# Patient Record
Sex: Male | Born: 1955 | Race: White | Hispanic: No | Marital: Married | Smoking: Current every day smoker
Health system: Southern US, Community
[De-identification: ages and names within clinical notes are randomized; demographics above are authoritative.]

## PROBLEM LIST (undated history)

## (undated) DIAGNOSIS — I509 Heart failure, unspecified: Secondary | ICD-10-CM

## (undated) DIAGNOSIS — N200 Calculus of kidney: Secondary | ICD-10-CM

## (undated) DIAGNOSIS — F17200 Nicotine dependence, unspecified, uncomplicated: Secondary | ICD-10-CM

## (undated) DIAGNOSIS — E7800 Pure hypercholesterolemia, unspecified: Secondary | ICD-10-CM

## (undated) DIAGNOSIS — I10 Essential (primary) hypertension: Secondary | ICD-10-CM

## (undated) DIAGNOSIS — J449 Chronic obstructive pulmonary disease, unspecified: Secondary | ICD-10-CM

---

## 1998-12-21 ENCOUNTER — Observation Stay (HOSPITAL_COMMUNITY): Admission: EM | Admit: 1998-12-21 | Discharge: 1998-12-21 | Payer: Self-pay | Admitting: Cardiology

## 2000-08-28 ENCOUNTER — Emergency Department (HOSPITAL_COMMUNITY): Admission: EM | Admit: 2000-08-28 | Discharge: 2000-08-28 | Payer: Self-pay | Admitting: Internal Medicine

## 2000-08-28 ENCOUNTER — Encounter: Payer: Self-pay | Admitting: Internal Medicine

## 2001-02-26 ENCOUNTER — Emergency Department (HOSPITAL_COMMUNITY): Admission: EM | Admit: 2001-02-26 | Discharge: 2001-02-26 | Payer: Self-pay | Admitting: Emergency Medicine

## 2001-09-16 ENCOUNTER — Ambulatory Visit (HOSPITAL_COMMUNITY): Admission: RE | Admit: 2001-09-16 | Discharge: 2001-09-16 | Payer: Self-pay | Admitting: Urology

## 2001-09-16 ENCOUNTER — Encounter: Payer: Self-pay | Admitting: Urology

## 2001-09-17 ENCOUNTER — Emergency Department (HOSPITAL_COMMUNITY): Admission: EM | Admit: 2001-09-17 | Discharge: 2001-09-17 | Payer: Self-pay | Admitting: Emergency Medicine

## 2001-10-08 ENCOUNTER — Emergency Department (HOSPITAL_COMMUNITY): Admission: EM | Admit: 2001-10-08 | Discharge: 2001-10-08 | Payer: Self-pay | Admitting: Emergency Medicine

## 2001-11-02 ENCOUNTER — Emergency Department (HOSPITAL_COMMUNITY): Admission: EM | Admit: 2001-11-02 | Discharge: 2001-11-02 | Payer: Self-pay | Admitting: Emergency Medicine

## 2001-11-13 ENCOUNTER — Emergency Department (HOSPITAL_COMMUNITY): Admission: EM | Admit: 2001-11-13 | Discharge: 2001-11-13 | Payer: Self-pay | Admitting: Emergency Medicine

## 2001-11-13 ENCOUNTER — Encounter: Payer: Self-pay | Admitting: Urology

## 2001-11-13 ENCOUNTER — Ambulatory Visit (HOSPITAL_COMMUNITY): Admission: RE | Admit: 2001-11-13 | Discharge: 2001-11-13 | Payer: Self-pay | Admitting: Urology

## 2002-01-01 ENCOUNTER — Emergency Department (HOSPITAL_COMMUNITY): Admission: EM | Admit: 2002-01-01 | Discharge: 2002-01-01 | Payer: Self-pay | Admitting: *Deleted

## 2002-03-18 ENCOUNTER — Emergency Department (HOSPITAL_COMMUNITY): Admission: EM | Admit: 2002-03-18 | Discharge: 2002-03-18 | Payer: Self-pay | Admitting: Emergency Medicine

## 2002-03-25 ENCOUNTER — Emergency Department (HOSPITAL_COMMUNITY): Admission: EM | Admit: 2002-03-25 | Discharge: 2002-03-25 | Payer: Self-pay | Admitting: Emergency Medicine

## 2002-04-09 ENCOUNTER — Emergency Department (HOSPITAL_COMMUNITY): Admission: EM | Admit: 2002-04-09 | Discharge: 2002-04-09 | Payer: Self-pay | Admitting: Emergency Medicine

## 2002-05-02 ENCOUNTER — Emergency Department (HOSPITAL_COMMUNITY): Admission: EM | Admit: 2002-05-02 | Discharge: 2002-05-02 | Payer: Self-pay | Admitting: Emergency Medicine

## 2002-05-08 ENCOUNTER — Emergency Department (HOSPITAL_COMMUNITY): Admission: EM | Admit: 2002-05-08 | Discharge: 2002-05-08 | Payer: Self-pay | Admitting: Emergency Medicine

## 2002-06-08 ENCOUNTER — Emergency Department (HOSPITAL_COMMUNITY): Admission: EM | Admit: 2002-06-08 | Discharge: 2002-06-08 | Payer: Self-pay | Admitting: Emergency Medicine

## 2002-06-24 ENCOUNTER — Emergency Department (HOSPITAL_COMMUNITY): Admission: EM | Admit: 2002-06-24 | Discharge: 2002-06-24 | Payer: Self-pay | Admitting: Emergency Medicine

## 2002-07-15 ENCOUNTER — Emergency Department (HOSPITAL_COMMUNITY): Admission: EM | Admit: 2002-07-15 | Discharge: 2002-07-15 | Payer: Self-pay | Admitting: Emergency Medicine

## 2002-09-11 ENCOUNTER — Emergency Department (HOSPITAL_COMMUNITY): Admission: EM | Admit: 2002-09-11 | Discharge: 2002-09-11 | Payer: Self-pay | Admitting: Emergency Medicine

## 2002-09-16 ENCOUNTER — Emergency Department (HOSPITAL_COMMUNITY): Admission: EM | Admit: 2002-09-16 | Discharge: 2002-09-16 | Payer: Self-pay | Admitting: Emergency Medicine

## 2002-09-18 ENCOUNTER — Encounter: Payer: Self-pay | Admitting: Emergency Medicine

## 2002-09-18 ENCOUNTER — Emergency Department (HOSPITAL_COMMUNITY): Admission: EM | Admit: 2002-09-18 | Discharge: 2002-09-18 | Payer: Self-pay | Admitting: Emergency Medicine

## 2002-10-15 ENCOUNTER — Emergency Department (HOSPITAL_COMMUNITY): Admission: EM | Admit: 2002-10-15 | Discharge: 2002-10-15 | Payer: Self-pay | Admitting: Emergency Medicine

## 2002-11-04 ENCOUNTER — Emergency Department (HOSPITAL_COMMUNITY): Admission: EM | Admit: 2002-11-04 | Discharge: 2002-11-04 | Payer: Self-pay | Admitting: Emergency Medicine

## 2003-02-27 ENCOUNTER — Emergency Department (HOSPITAL_COMMUNITY): Admission: EM | Admit: 2003-02-27 | Discharge: 2003-02-27 | Payer: Self-pay | Admitting: Emergency Medicine

## 2003-08-21 ENCOUNTER — Emergency Department (HOSPITAL_COMMUNITY): Admission: EM | Admit: 2003-08-21 | Discharge: 2003-08-21 | Payer: Self-pay | Admitting: Emergency Medicine

## 2003-10-14 ENCOUNTER — Emergency Department (HOSPITAL_COMMUNITY): Admission: EM | Admit: 2003-10-14 | Discharge: 2003-10-14 | Payer: Self-pay | Admitting: Emergency Medicine

## 2004-02-08 ENCOUNTER — Emergency Department (HOSPITAL_COMMUNITY): Admission: EM | Admit: 2004-02-08 | Discharge: 2004-02-08 | Payer: Self-pay | Admitting: Emergency Medicine

## 2004-12-11 ENCOUNTER — Emergency Department (HOSPITAL_COMMUNITY): Admission: EM | Admit: 2004-12-11 | Discharge: 2004-12-11 | Payer: Self-pay | Admitting: Emergency Medicine

## 2005-03-11 ENCOUNTER — Emergency Department (HOSPITAL_COMMUNITY): Admission: EM | Admit: 2005-03-11 | Discharge: 2005-03-11 | Payer: Self-pay | Admitting: Emergency Medicine

## 2005-10-20 ENCOUNTER — Emergency Department (HOSPITAL_COMMUNITY): Admission: EM | Admit: 2005-10-20 | Discharge: 2005-10-20 | Payer: Self-pay | Admitting: Emergency Medicine

## 2005-11-06 ENCOUNTER — Emergency Department (HOSPITAL_COMMUNITY): Admission: EM | Admit: 2005-11-06 | Discharge: 2005-11-06 | Payer: Self-pay | Admitting: Emergency Medicine

## 2005-11-16 ENCOUNTER — Emergency Department (HOSPITAL_COMMUNITY): Admission: EM | Admit: 2005-11-16 | Discharge: 2005-11-16 | Payer: Self-pay | Admitting: Emergency Medicine

## 2005-12-02 ENCOUNTER — Emergency Department (HOSPITAL_COMMUNITY): Admission: EM | Admit: 2005-12-02 | Discharge: 2005-12-02 | Payer: Self-pay | Admitting: Emergency Medicine

## 2006-05-18 ENCOUNTER — Emergency Department (HOSPITAL_COMMUNITY): Admission: EM | Admit: 2006-05-18 | Discharge: 2006-05-18 | Payer: Self-pay | Admitting: Emergency Medicine

## 2006-07-22 ENCOUNTER — Emergency Department (HOSPITAL_COMMUNITY): Admission: EM | Admit: 2006-07-22 | Discharge: 2006-07-22 | Payer: Self-pay | Admitting: Emergency Medicine

## 2006-08-18 ENCOUNTER — Emergency Department (HOSPITAL_COMMUNITY): Admission: EM | Admit: 2006-08-18 | Discharge: 2006-08-18 | Payer: Self-pay | Admitting: Emergency Medicine

## 2007-01-20 ENCOUNTER — Emergency Department (HOSPITAL_COMMUNITY): Admission: EM | Admit: 2007-01-20 | Discharge: 2007-01-20 | Payer: Self-pay | Admitting: Emergency Medicine

## 2007-02-15 ENCOUNTER — Emergency Department (HOSPITAL_COMMUNITY): Admission: EM | Admit: 2007-02-15 | Discharge: 2007-02-15 | Payer: Self-pay | Admitting: Emergency Medicine

## 2010-11-27 ENCOUNTER — Other Ambulatory Visit: Payer: Self-pay

## 2010-11-27 ENCOUNTER — Emergency Department (HOSPITAL_COMMUNITY): Payer: Medicare Other

## 2010-11-27 ENCOUNTER — Emergency Department (HOSPITAL_COMMUNITY)
Admission: EM | Admit: 2010-11-27 | Discharge: 2010-11-27 | Disposition: A | Discharge status: Home / Self Care | Payer: Medicare Other | Attending: Emergency Medicine | Admitting: Emergency Medicine

## 2010-11-27 ENCOUNTER — Encounter: Payer: Self-pay | Admitting: Emergency Medicine

## 2010-11-27 DIAGNOSIS — J449 Chronic obstructive pulmonary disease, unspecified: Secondary | ICD-10-CM | POA: Insufficient documentation

## 2010-11-27 DIAGNOSIS — I509 Heart failure, unspecified: Secondary | ICD-10-CM | POA: Insufficient documentation

## 2010-11-27 DIAGNOSIS — J4489 Other specified chronic obstructive pulmonary disease: Secondary | ICD-10-CM | POA: Insufficient documentation

## 2010-11-27 DIAGNOSIS — E119 Type 2 diabetes mellitus without complications: Secondary | ICD-10-CM | POA: Insufficient documentation

## 2010-11-27 DIAGNOSIS — I10 Essential (primary) hypertension: Secondary | ICD-10-CM | POA: Insufficient documentation

## 2010-11-27 DIAGNOSIS — E7800 Pure hypercholesterolemia, unspecified: Secondary | ICD-10-CM | POA: Insufficient documentation

## 2010-11-27 DIAGNOSIS — R0602 Shortness of breath: Secondary | ICD-10-CM | POA: Insufficient documentation

## 2010-11-27 DIAGNOSIS — F17200 Nicotine dependence, unspecified, uncomplicated: Secondary | ICD-10-CM | POA: Insufficient documentation

## 2010-11-27 HISTORY — DX: Pure hypercholesterolemia, unspecified: E78.00

## 2010-11-27 HISTORY — DX: Heart failure, unspecified: I50.9

## 2010-11-27 HISTORY — DX: Essential (primary) hypertension: I10

## 2010-11-27 HISTORY — DX: Chronic obstructive pulmonary disease, unspecified: J44.9

## 2010-11-27 LAB — DIFFERENTIAL
Basophils Relative: 1 % (ref 0–1)
Eosinophils Absolute: 0.1 10*3/uL (ref 0.0–0.7)
Neutrophils Relative %: 69 % (ref 43–77)

## 2010-11-27 LAB — BASIC METABOLIC PANEL
GFR calc Af Amer: >60 mL/min (ref >60)
GFR calc non Af Amer: >60 mL/min (ref >60)
Potassium: 4 mEq/L (ref 3.5–5.1)
Sodium: 133 mEq/L — ABNORMAL LOW (ref 135–145)

## 2010-11-27 LAB — CARDIAC PANEL(CRET KIN+CKTOT+MB+TROPI)
CK, MB: 4.7 ng/mL — ABNORMAL HIGH (ref 0.3–4.0)
Total CK: 130 U/L (ref 7–232)
Troponin I: <0.3 ng/mL (ref <0.30)

## 2010-11-27 LAB — CBC
MCH: 28.8 pg (ref 26.0–34.0)
MCHC: 32.7 g/dL (ref 30.0–36.0)
Platelets: 278 10*3/uL (ref 150–400)
RBC: 5.55 MIL/uL (ref 4.22–5.81)

## 2010-11-27 MED ORDER — ALBUTEROL SULFATE (5 MG/ML) 0.5% IN NEBU
5.0000 mg | INHALATION_SOLUTION | Freq: Once | RESPIRATORY_TRACT | Status: AC
  Administered 2010-11-27: 5 mg via RESPIRATORY_TRACT
  Filled 2010-11-27: qty 1

## 2010-11-27 MED ORDER — FUROSEMIDE 10 MG/ML IJ SOLN
40.0000 mg | Freq: Once | INTRAMUSCULAR | Status: DC
  Filled 2010-11-27: qty 4

## 2010-11-27 MED ORDER — IPRATROPIUM BROMIDE 0.02 % IN SOLN
0.5000 mg | Freq: Once | RESPIRATORY_TRACT | Status: AC
  Administered 2010-11-27: 0.5 mg via RESPIRATORY_TRACT
  Filled 2010-11-27: qty 2.5

## 2010-11-27 NOTE — ED Provider Notes (Addendum)
History    Scribed for Benny Lennert, MD, the patient was seen in room APAH1/APAH1. This chart was scribed by Katha Cabal. This patient's care was started at 21:38.     CSN: [id number] Arrival date & time: 11/27/2010  7:22 PM  Chief Complaint  Patient presents with  . Shortness of Breath    HPI  (Consider location/radiation/quality/duration/timing/severity/associated sxs/prior treatment)  HPI Ruben Gray is a 55 y.o. male with COPD presents to the Emergency Department complaining of sudden  Intermittent SOB episodes after eating with associated abdominal edema and bilateral lower extremity edema. Patient states that SOB is mildly alleviated after having a BM. Patient takes two separate diuertics.  SOB improved while in ED.  Denies chest pain.  Patient has hx of COPD, HTN, DM, hypercholesterolemia, and CHF.    PCP Dr. Malva Cogan     PAST MEDICAL HISTORY:  Past Medical History  Diagnosis Date  . Hypertension   . COPD (chronic obstructive pulmonary disease)   . Diabetes mellitus   . High blood cholesterol level   . CHF (congestive heart failure)     PAST SURGICAL HISTORY:  History reviewed. No pertinent past surgical history.  FAMILY HISTORY:  No family history on file.   SOCIAL HISTORY: History   Social History  . Marital Status: Married    Spouse Name: N/A    Number of Children: N/A  . Years of Education: N/A   Social History Main Topics  . Smoking status: Current Everyday Smoker -- 1.0 packs/day  . Smokeless tobacco: None  . Alcohol Use: No  . Drug Use: No  . Sexually Active:    Other Topics Concern  . None   Social History Narrative  . None     Review of Systems  Review of Systems  Constitutional: Negative for fatigue.  HENT: Negative for congestion, sinus pressure and ear discharge.   Eyes: Negative for discharge.  Respiratory: Positive for shortness of breath and wheezing. Negative for cough.   Cardiovascular: Negative for chest pain.    Gastrointestinal: Positive for abdominal distention. Negative for abdominal pain and diarrhea.  Genitourinary: Negative for frequency and hematuria.  Musculoskeletal: Negative for back pain.  Skin: Negative for rash.  Neurological: Negative for seizures and headaches.  Hematological: Negative.   Psychiatric/Behavioral: Negative for hallucinations.    Allergies  Review of patient's allergies indicates no known allergies.  Home Medications   Current Outpatient Rx  Name Route Sig Dispense Refill  . ACETAMINOPHEN 500 MG PO TABS Oral Take 1,000 mg by mouth daily as needed. For pain    . IBUPROFEN 200 MG PO TABS Oral Take 800 mg by mouth daily as needed. For pain     . OXYCODONE-ACETAMINOPHEN 5-325 MG PO TABS Oral Take 1 tablet by mouth every 4 (four) hours as needed. For pain       Physical Exam    BP 153/90  Pulse 99  Temp(Src) 98.1 F (36.7 C) (Oral)  Resp 21  Ht 5\' 10"  (1.778 m)  Wt 460 lb (208.655 kg)  BMI 66.00 kg/m2  SpO2 95%  Physical Exam  Constitutional: He is oriented to person, place, and time. He appears well-developed.       Very obese   HENT:  Head: Normocephalic and atraumatic.  Eyes: Conjunctivae and EOM are normal. No scleral icterus.  Neck: Neck supple. No thyromegaly present.  Cardiovascular: Normal rate and regular rhythm.  Exam reveals no gallop and no friction rub.   No murmur heard.  Pulmonary/Chest: No stridor. He has wheezes. He has no rales. He exhibits no tenderness.       Diffuse mild to moderate wheezing   Abdominal: Soft. He exhibits distension. There is no tenderness. There is no rebound.       Moderate abdominal edema   Musculoskeletal: Normal range of motion. He exhibits edema.       2+ edema in bilateral lower extremities   Lymphadenopathy:    He has no cervical adenopathy.  Neurological: He is alert and oriented to person, place, and time. Coordination normal.  Skin: No rash noted. No erythema.  Psychiatric: He has a normal mood and  affect. His behavior is normal.    ED Course  Procedures (including critical care time)  OTHER DATA REVIEWED: Nursing notes, vital signs, and past medical records reviewed.   DIAGNOSTIC STUDIES: Oxygen Saturation is 95% on room air, normal by my interpretation.       LABS / RADIOLOGY:  Results for orders placed during the hospital encounter of 11/27/10  CBC      Component Value Range   WBC 8.8  4.0 - 10.5 (K/uL)   RBC 5.55  4.22 - 5.81 (MIL/uL)   Hemoglobin 16.0  13.0 - 17.0 (g/dL)   HCT 16.1  09.6 - 04.5 (%)   MCV 88.3  78.0 - 100.0 (fL)   MCH 28.8  26.0 - 34.0 (pg)   MCHC 32.7  30.0 - 36.0 (g/dL)   RDW 40.9  81.1 - 91.4 (%)   Platelets 278  150 - 400 (K/uL)  DIFFERENTIAL      Component Value Range   Neutrophils Relative 69  43 - 77 (%)   Neutro Abs 6.1  1.7 - 7.7 (K/uL)   Lymphocytes Relative 21  12 - 46 (%)   Lymphs Abs 1.9  0.7 - 4.0 (K/uL)   Monocytes Relative 8  3 - 12 (%)   Monocytes Absolute 0.7  0.1 - 1.0 (K/uL)   Eosinophils Relative 1  0 - 5 (%)   Eosinophils Absolute 0.1  0.0 - 0.7 (K/uL)   Basophils Relative 1  0 - 1 (%)   Basophils Absolute 0.0  0.0 - 0.1 (K/uL)  BASIC METABOLIC PANEL      Component Value Range   Sodium 133 (*) 135 - 145 (mEq/L)   Potassium 4.0  3.5 - 5.1 (mEq/L)   Chloride 97  96 - 112 (mEq/L)   CO2 26  19 - 32 (mEq/L)   Glucose, Bld 124 (*) 70 - 99 (mg/dL)   BUN 8  6 - 23 (mg/dL)   Creatinine, Ser 7.82  0.50 - 1.35 (mg/dL)   Calcium 8.8  8.4 - 95.6 (mg/dL)   GFR calc non Af Amer >60  >60 (mL/min)   GFR calc Af Amer >60  >60 (mL/min)  CARDIAC PANEL(CRET KIN+CKTOT+MB+TROPI)      Component Value Range   Total CK 130  7 - 232 (U/L)   CK, MB 4.7 (*) 0.3 - 4.0 (ng/mL)   Troponin I <0.30  <0.30 (ng/mL)   Relative Index 3.6 (*) 0.0 - 2.5      Dg Chest 2 View  11/27/2010  *RADIOLOGY REPORT*  Clinical Data: Shortness of breath, fluid retention, morbidity cm  CHEST - 2 VIEW  Comparison: None.  Findings: Cardiomegaly with pulmonary  vascular congestion.  No frank interstitial edema.  Mild degenerative changes of the visualized thoracolumbar spine.  IMPRESSION: Cardiomegaly with pulmonary vascular congestion.  No frank interstitial edema.  Original Report Authenticated By: Charline Bills, M.D.      ED COURSE / COORDINATION OF CARE: 9:52 PM  Physical exam complete.  Will order breathing treatment.  Plan to discharge patient home.  Patient told to increase diuretic and follow up with PCP.    Orders Placed This Encounter  Procedures  . DG Chest 2 View  . CBC  . Differential  . Basic metabolic panel  . Cardiac panel(cret kin+cktot+mb+tropi)  . BNP (if Dyspnea)  . Weigh patient  . Cardiac monitoring  . Pulse oximetry, continuous  . Oxygen therapy  . ED EKG (<64mins upon arrival to the ED)  . Saline lock IV   Sob,  cht   IMPRESSION: Diagnoses that have been ruled out:  Diagnoses that are still under consideration:  Final diagnoses:   Pt improved before discharge  MEDICATIONS GIVEN IN THE E.D. Scheduled Meds:    . albuterol  5 mg Nebulization Once  . furosemide  40 mg Intravenous Once  . ipratropium  0.5 mg Nebulization Once   Continuous Infusions:     DISCHARGE MEDICATIONS: New Prescriptions   No medications on file     The chart was scribed for me under my direct supervision.  I personally performed the history, physical, and medical decision making and all procedures in the evaluation of this patient..    Date: 12/08/2010  Rate:90  Rhythm: normal sinus rhythm  QRS Axis: left  Intervals: normal  ST/T Wave abnormalities: nonspecific T wave changes  Conduction Disutrbances:none  Narrative Interpretation:   Old EKG Reviewed: none available         Benny Lennert, MD 11/27/10 2224  Benny Lennert, MD 12/08/10 [phone]

## 2010-11-27 NOTE — ED Notes (Signed)
Woke up sob yesterday increased resp distress since

## 2013-12-15 ENCOUNTER — Encounter (HOSPITAL_COMMUNITY): Payer: Self-pay | Admitting: Adult Health

## 2013-12-15 ENCOUNTER — Inpatient Hospital Stay (HOSPITAL_COMMUNITY): Payer: Medicare Other

## 2013-12-15 ENCOUNTER — Inpatient Hospital Stay (HOSPITAL_COMMUNITY)
Admission: AD | Admit: 2013-12-15 | Discharge: 2014-01-02 | DRG: 208 | Disposition: E | Payer: Medicare Other | Source: Other Acute Inpatient Hospital | Attending: Emergency Medicine | Admitting: Emergency Medicine

## 2013-12-15 DIAGNOSIS — J9600 Acute respiratory failure, unspecified whether with hypoxia or hypercapnia: Secondary | ICD-10-CM

## 2013-12-15 DIAGNOSIS — I2510 Atherosclerotic heart disease of native coronary artery without angina pectoris: Secondary | ICD-10-CM | POA: Diagnosis present

## 2013-12-15 DIAGNOSIS — R0603 Acute respiratory distress: Secondary | ICD-10-CM

## 2013-12-15 DIAGNOSIS — J9811 Atelectasis: Secondary | ICD-10-CM | POA: Diagnosis present

## 2013-12-15 DIAGNOSIS — J811 Chronic pulmonary edema: Secondary | ICD-10-CM

## 2013-12-15 DIAGNOSIS — Z6841 Body Mass Index (BMI) 40.0 and over, adult: Secondary | ICD-10-CM

## 2013-12-15 DIAGNOSIS — I10 Essential (primary) hypertension: Secondary | ICD-10-CM | POA: Diagnosis present

## 2013-12-15 DIAGNOSIS — Z23 Encounter for immunization: Secondary | ICD-10-CM | POA: Diagnosis not present

## 2013-12-15 DIAGNOSIS — Z882 Allergy status to sulfonamides status: Secondary | ICD-10-CM | POA: Diagnosis not present

## 2013-12-15 DIAGNOSIS — G9340 Encephalopathy, unspecified: Secondary | ICD-10-CM | POA: Diagnosis present

## 2013-12-15 DIAGNOSIS — J441 Chronic obstructive pulmonary disease with (acute) exacerbation: Principal | ICD-10-CM | POA: Diagnosis present

## 2013-12-15 DIAGNOSIS — A419 Sepsis, unspecified organism: Secondary | ICD-10-CM

## 2013-12-15 DIAGNOSIS — Z889 Allergy status to unspecified drugs, medicaments and biological substances status: Secondary | ICD-10-CM | POA: Diagnosis not present

## 2013-12-15 DIAGNOSIS — R6520 Severe sepsis without septic shock: Secondary | ICD-10-CM

## 2013-12-15 DIAGNOSIS — I509 Heart failure, unspecified: Secondary | ICD-10-CM | POA: Diagnosis present

## 2013-12-15 DIAGNOSIS — E872 Acidosis: Secondary | ICD-10-CM

## 2013-12-15 DIAGNOSIS — E1165 Type 2 diabetes mellitus with hyperglycemia: Secondary | ICD-10-CM | POA: Diagnosis present

## 2013-12-15 DIAGNOSIS — T884XXD Failed or difficult intubation, subsequent encounter: Secondary | ICD-10-CM

## 2013-12-15 DIAGNOSIS — J189 Pneumonia, unspecified organism: Secondary | ICD-10-CM | POA: Diagnosis present

## 2013-12-15 DIAGNOSIS — Z978 Presence of other specified devices: Secondary | ICD-10-CM

## 2013-12-15 DIAGNOSIS — I469 Cardiac arrest, cause unspecified: Secondary | ICD-10-CM | POA: Diagnosis not present

## 2013-12-15 DIAGNOSIS — K5900 Constipation, unspecified: Secondary | ICD-10-CM | POA: Diagnosis present

## 2013-12-15 DIAGNOSIS — F17210 Nicotine dependence, cigarettes, uncomplicated: Secondary | ICD-10-CM | POA: Diagnosis present

## 2013-12-15 DIAGNOSIS — J9620 Acute and chronic respiratory failure, unspecified whether with hypoxia or hypercapnia: Secondary | ICD-10-CM | POA: Diagnosis present

## 2013-12-15 DIAGNOSIS — J9602 Acute respiratory failure with hypercapnia: Secondary | ICD-10-CM

## 2013-12-15 DIAGNOSIS — I2699 Other pulmonary embolism without acute cor pulmonale: Secondary | ICD-10-CM | POA: Diagnosis not present

## 2013-12-15 DIAGNOSIS — J9690 Respiratory failure, unspecified, unspecified whether with hypoxia or hypercapnia: Secondary | ICD-10-CM | POA: Diagnosis present

## 2013-12-15 DIAGNOSIS — Z4659 Encounter for fitting and adjustment of other gastrointestinal appliance and device: Secondary | ICD-10-CM

## 2013-12-15 DIAGNOSIS — E662 Morbid (severe) obesity with alveolar hypoventilation: Secondary | ICD-10-CM | POA: Diagnosis present

## 2013-12-15 DIAGNOSIS — R0602 Shortness of breath: Secondary | ICD-10-CM | POA: Diagnosis present

## 2013-12-15 DIAGNOSIS — T829XXA Unspecified complication of cardiac and vascular prosthetic device, implant and graft, initial encounter: Secondary | ICD-10-CM

## 2013-12-15 DIAGNOSIS — Z0189 Encounter for other specified special examinations: Secondary | ICD-10-CM

## 2013-12-15 DIAGNOSIS — J9622 Acute and chronic respiratory failure with hypercapnia: Secondary | ICD-10-CM

## 2013-12-15 HISTORY — DX: Calculus of kidney: N20.0

## 2013-12-15 HISTORY — DX: Nicotine dependence, unspecified, uncomplicated: F17.200

## 2013-12-15 LAB — URINALYSIS, ROUTINE W REFLEX MICROSCOPIC
Glucose, UA: NEGATIVE mg/dL
Ketones, ur: 15 mg/dL — AB
Leukocytes, UA: NEGATIVE
Nitrite: NEGATIVE
Protein, ur: >300 mg/dL — AB
Specific Gravity, Urine: 1.025 (ref 1.005–1.030)
UROBILINOGEN UA: 0.2 mg/dL (ref 0.0–1.0)
pH: 5 (ref 5.0–8.0)

## 2013-12-15 LAB — COMPREHENSIVE METABOLIC PANEL
ALT: 8 U/L (ref 0–53)
ANION GAP: 11 (ref 5–15)
AST: 12 U/L (ref 0–37)
Albumin: 2.5 g/dL — ABNORMAL LOW (ref 3.5–5.2)
Alkaline Phosphatase: 61 U/L (ref 39–117)
BUN: 10 mg/dL (ref 6–23)
CALCIUM: 8.2 mg/dL — AB (ref 8.4–10.5)
CO2: 26 mEq/L (ref 19–32)
CREATININE: 0.89 mg/dL (ref 0.50–1.35)
Chloride: 100 mEq/L (ref 96–112)
GFR calc non Af Amer: >90 mL/min (ref >90)
Glucose, Bld: 154 mg/dL — ABNORMAL HIGH (ref 70–99)
Potassium: 4.1 mEq/L (ref 3.7–5.3)
Sodium: 137 mEq/L (ref 137–147)
Total Bilirubin: 0.3 mg/dL (ref 0.3–1.2)
Total Protein: 6.3 g/dL (ref 6.0–8.3)

## 2013-12-15 LAB — TROPONIN I: Troponin I: <0.3 ng/mL (ref <0.30)

## 2013-12-15 LAB — CBC
HEMATOCRIT: 38.9 % — AB (ref 39.0–52.0)
Hemoglobin: 12.1 g/dL — ABNORMAL LOW (ref 13.0–17.0)
MCH: 26.9 pg (ref 26.0–34.0)
MCHC: 31.1 g/dL (ref 30.0–36.0)
MCV: 86.6 fL (ref 78.0–100.0)
PLATELETS: 259 10*3/uL (ref 150–400)
RBC: 4.49 MIL/uL (ref 4.22–5.81)
RDW: 15.1 % (ref 11.5–15.5)
WBC: 15.8 10*3/uL — ABNORMAL HIGH (ref 4.0–10.5)

## 2013-12-15 LAB — POCT I-STAT 3, ART BLOOD GAS (G3+)
BICARBONATE: 28 meq/L — AB (ref 20.0–24.0)
O2 SAT: 93 %
PO2 ART: 73 mmHg — AB (ref 80.0–100.0)
TCO2: 30 mmol/L (ref 0–100)
pCO2 arterial: 57.8 mmHg (ref 35.0–45.0)
pH, Arterial: 7.287 — ABNORMAL LOW (ref 7.350–7.450)

## 2013-12-15 LAB — URINE MICROSCOPIC-ADD ON

## 2013-12-15 LAB — GLUCOSE, CAPILLARY
GLUCOSE-CAPILLARY: 183 mg/dL — AB (ref 70–99)
Glucose-Capillary: 144 mg/dL — ABNORMAL HIGH (ref 70–99)
Glucose-Capillary: 125 mg/dL — ABNORMAL HIGH (ref 70–99)

## 2013-12-15 LAB — LACTIC ACID, PLASMA: Lactic Acid, Venous: 1.6 mmol/L (ref 0.5–2.2)

## 2013-12-15 LAB — PRO B NATRIURETIC PEPTIDE: Pro B Natriuretic peptide (BNP): 737.5 pg/mL — ABNORMAL HIGH (ref 0–125)

## 2013-12-15 LAB — PROCALCITONIN: Procalcitonin: <0.1 ng/mL

## 2013-12-15 LAB — MRSA PCR SCREENING: MRSA by PCR: NEGATIVE

## 2013-12-15 MED ORDER — INSULIN ASPART 100 UNIT/ML ~~LOC~~ SOLN
0.0000 [IU] | SUBCUTANEOUS | Status: DC
  Administered 2013-12-15 – 2013-12-16 (×5): 3 [IU] via SUBCUTANEOUS
  Administered 2013-12-17: 4 [IU] via SUBCUTANEOUS
  Administered 2013-12-17 (×3): 3 [IU] via SUBCUTANEOUS
  Administered 2013-12-17: 4 [IU] via SUBCUTANEOUS
  Administered 2013-12-17 – 2013-12-18 (×3): 3 [IU] via SUBCUTANEOUS

## 2013-12-15 MED ORDER — INSULIN ASPART 100 UNIT/ML ~~LOC~~ SOLN: 0.0000 [IU] | Freq: Three times a day (TID) | SUBCUTANEOUS | Status: DC

## 2013-12-15 MED ORDER — SODIUM CHLORIDE 0.9 % IV SOLN
0.0000 ug/h | INTRAVENOUS | Status: DC
  Administered 2013-12-15 – 2013-12-16 (×2): 200 ug/h via INTRAVENOUS
  Administered 2013-12-16: 250 ug/h via INTRAVENOUS
  Administered 2013-12-17 (×3): 200 ug/h via INTRAVENOUS
  Filled 2013-12-15 (×6): qty 50

## 2013-12-15 MED ORDER — IPRATROPIUM-ALBUTEROL 0.5-2.5 (3) MG/3ML IN SOLN
3.0000 mL | Freq: Four times a day (QID) | RESPIRATORY_TRACT | Status: DC
  Administered 2013-12-15 – 2013-12-18 (×12): 3 mL via RESPIRATORY_TRACT
  Filled 2013-12-15 (×12): qty 3

## 2013-12-15 MED ORDER — FENTANYL CITRATE 0.05 MG/ML IJ SOLN: 50.0000 ug | Freq: Once | INTRAMUSCULAR | Status: AC

## 2013-12-15 MED ORDER — SODIUM CHLORIDE 0.9 % IV SOLN
INTRAVENOUS | Status: DC
  Administered 2013-12-15: 50 mL/h via INTRAVENOUS
  Administered 2013-12-16 – 2013-12-18 (×3): via INTRAVENOUS

## 2013-12-15 MED ORDER — CETYLPYRIDINIUM CHLORIDE 0.05 % MT LIQD
7.0000 mL | Freq: Four times a day (QID) | OROMUCOSAL | Status: DC
  Administered 2013-12-15 – 2013-12-18 (×11): 7 mL via OROMUCOSAL

## 2013-12-15 MED ORDER — ASPIRIN 81 MG PO CHEW: 324.0000 mg | CHEWABLE_TABLET | ORAL | Status: DC

## 2013-12-15 MED ORDER — SODIUM CHLORIDE 0.9 % IV SOLN: 250.0000 mL | INTRAVENOUS | Status: DC | PRN

## 2013-12-15 MED ORDER — HEPARIN SODIUM (PORCINE) 5000 UNIT/ML IJ SOLN
5000.0000 [IU] | Freq: Three times a day (TID) | INTRAMUSCULAR | Status: DC
  Administered 2013-12-15 – 2013-12-18 (×9): 5000 [IU] via SUBCUTANEOUS
  Filled 2013-12-15 (×10): qty 1

## 2013-12-15 MED ORDER — DEXTROSE 5 % IV SOLN
2.0000 g | INTRAVENOUS | Status: DC
  Administered 2013-12-15 – 2013-12-16 (×2): 2 g via INTRAVENOUS
  Filled 2013-12-15 (×3): qty 2

## 2013-12-15 MED ORDER — ALBUTEROL SULFATE (2.5 MG/3ML) 0.083% IN NEBU
2.5000 mg | INHALATION_SOLUTION | RESPIRATORY_TRACT | Status: DC | PRN
  Administered 2013-12-17: 2.5 mg via RESPIRATORY_TRACT
  Filled 2013-12-15: qty 3

## 2013-12-15 MED ORDER — MIDAZOLAM HCL 2 MG/2ML IJ SOLN
2.0000 mg | INTRAMUSCULAR | Status: DC | PRN
  Administered 2013-12-15 – 2013-12-18 (×5): 2 mg via INTRAVENOUS
  Filled 2013-12-15 (×5): qty 2

## 2013-12-15 MED ORDER — MIDAZOLAM HCL 2 MG/2ML IJ SOLN: 1.0000 mg | INTRAMUSCULAR | Status: DC | PRN

## 2013-12-15 MED ORDER — ASPIRIN 300 MG RE SUPP: 300.0000 mg | RECTAL | Status: DC

## 2013-12-15 MED ORDER — CHLORHEXIDINE GLUCONATE 0.12 % MT SOLN
15.0000 mL | Freq: Two times a day (BID) | OROMUCOSAL | Status: DC
  Administered 2013-12-15 – 2013-12-18 (×6): 15 mL via OROMUCOSAL
  Filled 2013-12-15 (×6): qty 15

## 2013-12-15 MED ORDER — FENTANYL BOLUS VIA INFUSION
50.0000 ug | INTRAVENOUS | Status: DC | PRN
  Administered 2013-12-17: 100 ug via INTRAVENOUS
  Administered 2013-12-17: 50 ug via INTRAVENOUS
  Administered 2013-12-17 – 2013-12-18 (×6): 100 ug via INTRAVENOUS
  Filled 2013-12-15: qty 100

## 2013-12-15 MED ORDER — DEXTROSE 5 % IV SOLN
500.0000 mg | Freq: Every day | INTRAVENOUS | Status: DC
  Administered 2013-12-15 – 2013-12-17 (×3): 500 mg via INTRAVENOUS
  Filled 2013-12-15 (×4): qty 500

## 2013-12-15 MED ORDER — FAMOTIDINE 40 MG/5ML PO SUSR
20.0000 mg | Freq: Two times a day (BID) | ORAL | Status: DC
  Administered 2013-12-15 – 2013-12-17 (×6): 20 mg
  Filled 2013-12-15 (×8): qty 2.5

## 2013-12-15 NOTE — Procedures (Signed)
Central Venous Catheter Insertion Procedure Note Randal BubaFrankie L Montour [id number] 1955/09/26  Procedure: Insertion of Central Venous Catheter Indications: Assessment of intravascular volume and Drug and/or fluid administration  Procedure Details Consent: Unable to obtain consent because of emergent medical necessity. Time Out: Verified patient identification, verified procedure, site/side was marked, verified correct patient position, special equipment/implants available, medications/allergies/relevent history reviewed, required imaging and test results available.  Performed  Maximum sterile technique was used including antiseptics, cap, gloves, gown, hand hygiene, mask and sheet. Skin prep: Chlorhexidine; local anesthetic administered A antimicrobial bonded/coated triple lumen catheter was placed in the left internal jugular vein using the Seldinger technique.  Evaluation Blood flow good Complications: No apparent complications Patient did tolerate procedure well. Chest X-ray ordered to verify placement.  CXR: pending.  Performed under direct MD supervision.  Performed using ultrasound guidance.  Wire visualized in vessel under ultrasound.   Tolerated well  US  Mcarthur RossettiDaniel J. Tyson AliasFeinstein, MD, FACP Pgr: [phone] [facility]

## 2013-12-15 NOTE — H&P (Signed)
PULMONARY / CRITICAL CARE MEDICINE   Name: Ruben Gray MRN: [id number] DOB: 11/27/1955    ADMISSION DATE:  12/17/2013  REF[facility]ING MD :  [facility]   CHIEF COMPLAINT:  Respiratory failure   INITIAL PRESENTATION: 58yo morbidly obese male with hx COPD, CAD, HTN, CHF [facility] with R sided flank pain, progressive SOB x 2 days.  Failed bipap and was intubated in ER with reported difficult airway, size 7.0ETT and tx to [facility].   STUDIES:  2D echo 10/14>>> Renal u/s 10/14>>>  SIGNIFICANT EVENTS: 10/14 [facility]    HISTORY OF PRESENT ILLNESS:  58yo morbidly obese male with hx COPD, CAD, HTN, CHF [facility] with R sided flank pain, progressive SOB x 2 days.  Failed bipap and was intubated in ER with reported difficult airway, size 7.0ETT and tx to [facility].   PAST MEDICAL HISTORY :  Past Medical History  Diagnosis Date  . Hypertension   . COPD (chronic obstructive pulmonary disease)   . Diabetes mellitus   . High blood cholesterol level   . CHF (congestive heart failure)   . Kidney stone      has no past surgical history on file. Prior to Admission medications   Medication Sig Start Date End Date Taking? Authorizing Provider  acetaminophen (TYLENOL) 500 MG tablet Take 1,000 mg by mouth daily as needed. For pain    Historical Provider, MD  ibuprofen (ADVIL,MOTRIN) 200 MG tablet Take 800 mg by mouth daily as needed. For pain     Historical Provider, MD  oxyCODONE-acetaminophen (PERCOCET) 5-325 MG per tablet Take 1 tablet by mouth every 4 (four) hours as needed. For pain     Historical Provider, MD   Allergies  Allergen Reactions  . Sulfa Antibiotics     Itchy rash  . Toradol [Ketorolac Tromethamine]     Jittery, sweat    FAMILY HISTORY:  has no family status information on file.   SOCIAL HISTORY:  reports that he has been smoking.  He does not have any smokeless tobacco history on file. He reports that  he does not drink alcohol or use illicit drugs.  REVIEW OF SYSTEMS:  Unable.  Pt sedated on vent.   SUBJECTIVE:   VITAL SIGNS: Temp:  [98 F (36.7 C)] 98 F (36.7 C) (10/14 1208) Pulse Rate:  [110] 110 (10/14 1150) Resp:  [19] 19 (10/14 1150) SpO2:  [97 %] 97 % (10/14 1150) FiO2 (%):  [100 %] 100 % (10/14 1150) HEMODYNAMICS:   VENTILATOR SETTINGS: Vent Mode:  [-] PRVC FiO2 (%):  [100 %] 100 % Set Rate:  [18 bmp] 18 bmp Vt Set:  [650 mL] 650 mL PEEP:  [5 cmH20] 5 cmH20 Plateau Pressure:  [25 cmH20] 25 cmH20 INTAKE / OUTPUT: No intake or output data in the 24 hours ending 12/31/2013 1245  PHYSICAL EXAMINATION: General:  Morbidly obese, disheveled male, NAD on vent  Neuro:  Sedated, RASS +1, follows some commands, MAE, gen weakness  HEENT:  Mm dry, no JVD, ETT Cardiovascular:  s1s2 rrr Lungs:  resps even, non labored on vent, few scattered rhonchi, diminished R, few exp wheeze  Abdomen:  Obese, enormous pannus, soft, +bs Musculoskeletal:  Warm and dry, 1+ BLE edema   LABS:  CBC No results found for this basename: WBC, HGB, HCT, PLT,  in the last 168 hours Coag's No results found for this basename: APTT, INR,  in the last 168 hours BMET No results  found for this basename: NA, K, CL, CO2, BUN, CREATININE, GLUCOSE,  in the last 168 hours Electrolytes No results found for this basename: CALCIUM, MG, PHOS,  in the last 168 hours Sepsis Markers No results found for this basename: LATICACIDVEN, PROCALCITON, O2SATVEN,  in the last 168 hours ABG  Recent Labs Lab 12/14/2013 1238  PHART 7.287*  PCO2ART 57.8*  PO2ART 73.0*   Liver Enzymes No results found for this basename: AST, ALT, ALKPHOS, BILITOT, ALBUMIN,  in the last 168 hours Cardiac Enzymes No results found for this basename: TROPONINI, PROBNP,  in the last 168 hours Glucose  Recent Labs Lab 12/17/2013 1159  GLUCAP 183*    Imaging No results found.   ASSESSMENT / PLAN:  PULMONARY OETT 10/14>>>  Acute on  chronic respiratory failure - likely multifactorial r/t CAP, CHF, OHS/OSA and underlying COPD +/- AECOPD R hemithorax atx/collapse v effusion on [facility] CXR Difficult airway - per OSH reports P:   Vent support  ABG now  CXR now abx for CAP  BD's  No need systemic steroids for now  Daily SBT  ??FOB - await f/u CXR to see if R opens up  CARDIOVASCULAR Hx CHF  Hx HTN  P:  Hold home meds for now - needs med rec   RENAL Hx renal calculi, R flank pain - renal function ok per [facility] P:   Renal ultrasound  BMET now and in am  U/A   GASTROINTESTINAL Morbid obesity  P:   PPI   HEMATOLOGIC No active issue  P:  F/u cbc  SQ heparin   INFECTIOUS Suspected CAP - [facility] but pt is from home, no known recent hospitalization P:   BCx2 10/14>>> UC 10/14>>> Sputum 10/14>>> Rocephin 10/14>>> Azithro 10/14>>>  ENDOCRINE Hyperglycemia  P:   SSI  HgbA1c   NEUROLOGIC Encephalopathy  P:   RASS goal: -1 Sedation protocol while on vent  Daily WUA    Family updated: no family available 10/14   TODAY'S SUMMARY:  58yo morbidly obese male with acute on chronic resp failure r/t CAP, decompensated OSA/OHS +/- CHF.  Difficult airway.  Pan culture, f/u labs, abx as above.  Renal ultrasound in setting presenting flank pain and hx stones but renal function ok.   I have personally obtained a history, examined the patient, evaluated laboratory and imaging results, formulated the assessment and plan and placed orders. CRITICAL CARE: The patient is critically ill with multiple organ systems failure and requires high complexity decision making for assessment and support, frequent evaluation and titration of therapies, application of advanced monitoring technologies and extensive interpretation of multiple databases. Critical Care Time devoted to patient care services described in this note is 60 minutes.    Dirk DressKaty Whiteheart, NP 12/13/2013  12:45 PM Pager: [phone]  [phone] or [phone]  Levy Pupaobert Erine Phenix, MD, PhD 12/17/2013, 12:45 PM [facility] [phone] or if no answer [phone]

## 2013-12-15 NOTE — Progress Notes (Signed)
ANTIBIOTIC CONSULT NOTE - INITIAL  Pharmacy Consult for Ceftriaxone Indication: rule out pneumonia  Allergies  Allergen Reactions  . Sulfa Antibiotics     Itchy rash  . Toradol [Ketorolac Tromethamine]     Jittery, sweat    Patient Measurements: Weight: 502 lb (227.706 kg) Vital Signs: Temp: 98 F (36.7 C) (10/14 1208) Temp Source: Oral (10/14 1208) BP: 109/53 mmHg (10/14 1425) Pulse Rate: 99 (10/14 1425) Intake/Output from previous day:   Intake/Output from this shift: Total I/O In: 413.7 [I.V.:113.7; IV Piggyback:300] Out: -   Labs:  Recent Labs  12/02/2013 1327  WBC 15.8*  HGB 12.1*  PLT 259  CREATININE 0.89   The CrCl is unknown because both a height and weight (above a minimum accepted value) are required for this calculation. No results found for this basename: VANCOTROUGH, Leodis BinetVANCOPEAK, VANCORANDOM, GENTTROUGH, GENTPEAK, GENTRANDOM, TOBRATROUGH, TOBRAPEAK, TOBRARND, AMIKACINPEAK, AMIKACINTROU, AMIKACIN,  in the last 72 hours   Microbiology: Recent Results (from the past 720 hour(s))  MRSA PCR SCREENING     Status: None   Collection Time    12/16/2013 12:14 PM      Result Value Ref Range Status   MRSA by PCR NEGATIVE  NEGATIVE Final   Comment:            The GeneXpert MRSA Assay (FDA     approved for NASAL specimens     only), is one component of a     comprehensive MRSA colonization     surveillance program. It is not     intended to diagnose MRSA     infection nor to guide or     monitor treatment for     MRSA infections.    Medical History: Past Medical History  Diagnosis Date  . Hypertension   . COPD (chronic obstructive pulmonary disease)   . Diabetes mellitus   . High blood cholesterol level   . CHF (congestive heart failure)   . Kidney stone    Assessment: 58 yo obese male who [facility] with R-sided flank pain and acute respiratory failure requiring intubation and [facility]. Pharmacy was consulted  to dose ceftriaxone and azithromycin for rule out CAP. Sputum, blood, and urine cultures are pending. WBC is 15.8. Patient is currently afebrile. CXR reports extensive bilateral airspace disease worse on left.   Goal of Therapy:  Clinical resolution of infection  Plan:  Ceftriaxone 2g IV q24h.  Continue Azithromycin 500mg  IV q24h. Monitor culture results, renal function, and clinical status.   Link SnufferJessica Era Parr, PharmD, BCPS Clinical Pharmacist [phone] 01/01/2014,3:48 PM

## 2013-12-15 NOTE — Progress Notes (Addendum)
  Echocardiogram 2D Echocardiogram limited has been performed.  Ruben BeamsGREGORY, Ruben Gray 12/14/2013, 3:44 PM

## 2013-12-16 ENCOUNTER — Encounter (HOSPITAL_COMMUNITY): Payer: Self-pay | Admitting: *Deleted

## 2013-12-16 DIAGNOSIS — J189 Pneumonia, unspecified organism: Secondary | ICD-10-CM

## 2013-12-16 DIAGNOSIS — E872 Acidosis: Secondary | ICD-10-CM

## 2013-12-16 DIAGNOSIS — T829XXA Unspecified complication of cardiac and vascular prosthetic device, implant and graft, initial encounter: Secondary | ICD-10-CM

## 2013-12-16 LAB — CBC
HCT: 35.5 % — ABNORMAL LOW (ref 39.0–52.0)
Hemoglobin: 11.2 g/dL — ABNORMAL LOW (ref 13.0–17.0)
MCH: 27.3 pg (ref 26.0–34.0)
MCHC: 31.5 g/dL (ref 30.0–36.0)
MCV: 86.4 fL (ref 78.0–100.0)
PLATELETS: 253 10*3/uL (ref 150–400)
RBC: 4.11 MIL/uL — AB (ref 4.22–5.81)
RDW: 15.3 % (ref 11.5–15.5)
WBC: 13.6 10*3/uL — ABNORMAL HIGH (ref 4.0–10.5)

## 2013-12-16 LAB — BLOOD GAS, ARTERIAL
ACID-BASE EXCESS: 0.5 mmol/L (ref 0.0–2.0)
BICARBONATE: 25.7 meq/L — AB (ref 20.0–24.0)
Drawn by: 36989
FIO2: 0.8 %
O2 Saturation: 97.4 %
PCO2 ART: 50.2 mmHg — AB (ref 35.0–45.0)
PEEP: 5 cmH2O
PO2 ART: 103 mmHg — AB (ref 80.0–100.0)
Patient temperature: 98.6
RATE: 20 resp/min
TCO2: 27.3 mmol/L (ref 0–100)
VT: 650 mL
pH, Arterial: 7.33 — ABNORMAL LOW (ref 7.350–7.450)

## 2013-12-16 LAB — BASIC METABOLIC PANEL
ANION GAP: 9 (ref 5–15)
BUN: 13 mg/dL (ref 6–23)
CALCIUM: 8.2 mg/dL — AB (ref 8.4–10.5)
CO2: 26 mEq/L (ref 19–32)
Chloride: 99 mEq/L (ref 96–112)
Creatinine, Ser: 1.16 mg/dL (ref 0.50–1.35)
GFR, EST AFRICAN AMERICAN: 78 mL/min — AB (ref >90)
GFR, EST NON AFRICAN AMERICAN: 67 mL/min — AB (ref >90)
Glucose, Bld: 118 mg/dL — ABNORMAL HIGH (ref 70–99)
Potassium: 4.2 mEq/L (ref 3.7–5.3)
SODIUM: 134 meq/L — AB (ref 137–147)

## 2013-12-16 LAB — URINE CULTURE
COLONY COUNT: NO GROWTH
Culture: NO GROWTH

## 2013-12-16 LAB — GLUCOSE, CAPILLARY
GLUCOSE-CAPILLARY: 118 mg/dL — AB (ref 70–99)
GLUCOSE-CAPILLARY: 101 mg/dL — AB (ref 70–99)
Glucose-Capillary: 116 mg/dL — ABNORMAL HIGH (ref 70–99)
Glucose-Capillary: 121 mg/dL — ABNORMAL HIGH (ref 70–99)
Glucose-Capillary: 124 mg/dL — ABNORMAL HIGH (ref 70–99)
Glucose-Capillary: 130 mg/dL — ABNORMAL HIGH (ref 70–99)

## 2013-12-16 LAB — TROPONIN I: Troponin I: <0.3 ng/mL (ref <0.30)

## 2013-12-16 MED ORDER — INFLUENZA VAC SPLIT QUAD 0.5 ML IM SUSY
0.5000 mL | PREFILLED_SYRINGE | INTRAMUSCULAR | Status: DC
  Filled 2013-12-16: qty 0.5

## 2013-12-16 MED ORDER — PRO-STAT SUGAR FREE PO LIQD
30.0000 mL | Freq: Two times a day (BID) | ORAL | Status: AC
  Administered 2013-12-16 – 2013-12-17 (×2): 30 mL
  Filled 2013-12-16 (×2): qty 30

## 2013-12-16 MED ORDER — FUROSEMIDE 10 MG/ML IJ SOLN
40.0000 mg | Freq: Two times a day (BID) | INTRAMUSCULAR | Status: DC
  Administered 2013-12-16 – 2013-12-17 (×2): 40 mg via INTRAVENOUS
  Filled 2013-12-16 (×4): qty 4

## 2013-12-16 MED ORDER — LACTULOSE 10 GM/15ML PO SOLN
20.0000 g | Freq: Two times a day (BID) | ORAL | Status: DC
  Administered 2013-12-16 – 2013-12-17 (×4): 20 g via ORAL
  Filled 2013-12-16 (×6): qty 30

## 2013-12-16 MED ORDER — DOCUSATE SODIUM 50 MG/5ML PO LIQD
100.0000 mg | Freq: Two times a day (BID) | ORAL | Status: DC
  Administered 2013-12-16 – 2013-12-17 (×4): 100 mg
  Filled 2013-12-16 (×6): qty 10

## 2013-12-16 MED ORDER — PNEUMOCOCCAL VAC POLYVALENT 25 MCG/0.5ML IJ INJ
0.5000 mL | INJECTION | INTRAMUSCULAR | Status: AC
  Administered 2013-12-17: 0.5 mL via INTRAMUSCULAR
  Filled 2013-12-16: qty 0.5

## 2013-12-16 MED ORDER — VITAL HIGH PROTEIN PO LIQD
1000.0000 mL | ORAL | Status: DC
  Administered 2013-12-16 – 2013-12-17 (×2): 1000 mL
  Administered 2013-12-17: 07:00:00
  Filled 2013-12-16 (×5): qty 1000

## 2013-12-16 NOTE — Progress Notes (Signed)
INITIAL NUTRITION ASSESSMENT  DOCUMENTATION CODES Per approved criteria  -Morbid Obesity   INTERVENTION:  Initiate TF via OGT with Vital High Protein at 25 ml/h and Prostat 30 ml BID on day 1; on day 2, d/c Prostat and increase to goal rate of 90 ml/h (2160 ml per day) to provide 2160 kcals (63% of estimated needs), 189 gm protein, 1806 ml free water daily.  NUTRITION DIAGNOSIS: Inadequate oral intake related to inability to eat as evidenced by NPO status.   Goal: Enteral nutrition to provide 60-70% of estimated calorie needs (22-25 kcals/kg ideal body weight) and 100% of estimated protein needs, based on ASPEN guidelines for hypocaloric, high protein feeding in critically ill obese individuals  Monitor:  TF tolerance/adequacy, weight trend, labs, vent status.  Reason for Assessment: MD Consult for TF initiation and management.  58 y.o. male  Admitting Dx: Respiratory failure  ASSESSMENT: 58 yo morbidly obese male with hx COPD, CAD, HTN, CHF [facility] with R sided flank pain, progressive SOB x 2 days. Failed bipap and was intubated in ER with reported difficult airway, size 7.0ETT and tx [facility].   Discussed patient in ICU rounds today. RD to order TF to start today. Nutrition focused physical exam completed.  No muscle or subcutaneous fat depletion noticed.  Patient is currently intubated on ventilator support MV: 12.8 L/min Temp (24hrs), Avg:98.9 F (37.2 C), Min:98.5 F (36.9 C), Max:99.6 F (37.6 C)   Height: Ht Readings from Last 1 Encounters:  11/27/10 5\' 10"  (1.778 m)    Weight: Wt Readings from Last 1 Encounters:  12/16/13 494 lb (224.077 kg)    Ideal Body Weight: 75.5 kg  % Ideal Body Weight: 297%  Wt Readings from Last 10 Encounters:  12/16/13 494 lb (224.077 kg)  11/27/10 460 lb (208.655 kg)    Usual Body Weight: 460 lb (3 years ago)  % Usual Body Weight: 107%  BMI:  Body mass index is 70.88 kg/(m^2). class 3,  extreme/morbid obesity  Estimated Nutritional Needs: Kcal: 3406 Protein: >/= 170 gm Fluid: 3 L  Skin: stage 1 pressure ulcer on sacrum, stage 2 pressure ulcer on left foot  Diet Order:  NPO  EDUCATION NEEDS: -Education not appropriate at this time   Intake/Output Summary (Last 24 hours) at 12/16/13 1205 Last data filed at 12/16/13 1000  Gross per 24 hour  Intake 1916.17 ml  Output    265 ml  Net 1651.17 ml    Last BM: None documented since admission   Labs:   Recent Labs Lab 12/21/2013 1327 12/16/13 0110  NA 137 134*  K 4.1 4.2  CL 100 99  CO2 26 26  BUN 10 13  CREATININE 0.89 1.16  CALCIUM 8.2* 8.2*  GLUCOSE 154* 118*    CBG (last 3)   Recent Labs  12/14/2013 2343 12/16/13 0418 12/16/13 0750  GLUCAP 121* 118* 130*    Scheduled Meds: . antiseptic oral rinse  7 mL Mouth Rinse QID  . azithromycin  500 mg Intravenous Daily  . cefTRIAXone (ROCEPHIN)  IV  2 g Intravenous Q24H  . chlorhexidine  15 mL Mouth Rinse BID  . famotidine  20 mg Per Tube BID  . heparin  5,000 Units Subcutaneous 3 times per day  . insulin aspart  0-20 Units Subcutaneous 6 times per day  . ipratropium-albuterol  3 mL Nebulization Q6H    Continuous Infusions: . sodium chloride 50 mL/hr at 12/16/13 0800  . fentaNYL infusion INTRAVENOUS 250  mcg/hr (12/16/13 1000)    Past Medical History  Diagnosis Date  . Hypertension   . COPD (chronic obstructive pulmonary disease)   . Diabetes mellitus   . High blood cholesterol level   . CHF (congestive heart failure)   . Kidney stone   . Smoker     No past surgical history on file.  Joaquin CourtsKimberly Darrah Dredge, RD, LDN, CNSC Pager [phone] After Hours Pager [phone]

## 2013-12-16 NOTE — Progress Notes (Signed)
PULMONARY / CRITICAL CARE MEDICINE   Name: Ruben Gray MRN: [id number] DOB: [birth year]    ADMISSION DATE:  12/22/2013  REFERRING MD :  Maryruth Bun   CHIEF COMPLAINT:  Respiratory failure   INITIAL PRESENTATION: 57yo morbidly obese male with hx COPD, CAD, HTN, CHF presented 10/14 to [facility] hospital with R sided flank pain, progressive SOB x 2 days.  Failed bipap and was intubated in ER with reported difficult airway, size 7.0ETT and tx to [facility].   STUDIES:  2D echo 10/14>>>EF wnl probably, poor images Renal u/s 10/14>>>neg hydro  SIGNIFICANT EVENTS: 10/14 admitted from [facility]   SUBJECTIVE: no pressors, fio2 high improved  VITAL SIGNS: Temp:  [98.3 F (36.8 C)-99.6 F (37.6 C)] 98.3 F (36.8 C) (10/15 1220) Pulse Rate:  [90-111] 90 (10/15 1200) Resp:  [13-36] 20 (10/15 1200) BP: (68-178)/(44-126) 93/55 mmHg (10/15 1200) SpO2:  [89 %-98 %] 93 % (10/15 1200) FiO2 (%):  [40 %-80 %] 40 % (10/15 1200) Weight:  [224.077 kg (494 lb)] 224.077 kg (494 lb) (10/15 0500) HEMODYNAMICS:   VENTILATOR SETTINGS: Vent Mode:  [-] PRVC FiO2 (%):  [40 %-80 %] 40 % Set Rate:  [20 bmp] 20 bmp Vt Set:  [650 mL] 650 mL PEEP:  [5 cmH20] 5 cmH20 Plateau Pressure:  [25 cmH20-39 cmH20] 29 cmH20 INTAKE / OUTPUT:  Intake/Output Summary (Last 24 hours) at 12/16/13 1245 Last data filed at 12/16/13 1200  Gross per 24 hour  Intake 2386.17 ml  Output    305 ml  Net 2081.17 ml    PHYSICAL EXAMINATION: General:  Morbidly obese, vent Neuro:  Sedated, RASS -1, follows some commands, moves all ext HEENT:  Mm dry,  Tough to tell JVD, ETT Cardiovascular:  s1s2 rrr distant Lungs:  Reduced, ronchi Abdomen:  Obese, enormous pannus, soft, +bs Musculoskeletal:  Warm and dry, 1+ BLE edema   LABS:  CBC  Recent Labs Lab 12/17/2013 1327 12/16/13 0110  WBC 15.8* 13.6*  HGB 12.1* 11.2*  HCT 38.9* 35.5*  PLT 259 253   Coag's No results found for this basename: APTT, INR,  in the last 168  hours BMET  Recent Labs Lab 12/25/2013 1327 12/16/13 0110  NA 137 134*  K 4.1 4.2  CL 100 99  CO2 26 26  BUN 10 13  CREATININE 0.89 1.16  GLUCOSE 154* 118*   Electrolytes  Recent Labs Lab 12/14/2013 1327 12/16/13 0110  CALCIUM 8.2* 8.2*   Sepsis Markers  Recent Labs Lab 12/17/2013 1327  LATICACIDVEN 1.6  PROCALCITON <0.10   ABG  Recent Labs Lab 12/25/2013 1238 12/16/13 0500  PHART 7.287* 7.330*  PCO2ART 57.8* 50.2*  PO2ART 73.0* 103.0*   Liver Enzymes  Recent Labs Lab 12/11/2013 1327  AST 12  ALT 8  ALKPHOS 61  BILITOT 0.3  ALBUMIN 2.5*   Cardiac Enzymes  Recent Labs Lab 12/22/2013 1327 12/23/2013 1800 12/16/13 0110  TROPONINI <0.30 <0.30 <0.30  PROBNP 737.5*  --   --    Glucose  Recent Labs Lab 12/17/2013 1600 12/17/2013 1923 12/24/2013 2343 12/16/13 0418 12/16/13 0750 12/16/13 1218  GLUCAP 144* 125* 121* 118* 130* 124*    Imaging US Renal  12/17/2013   CLINICAL DATA:  59 year old male with acute right side flank pain and shortness of Breath. Initial encounter.  EXAM: RENAL/URINARY TRACT ULTRASOUND COMPLETE  COMPARISON:  None.  FINDINGS: Right Kidney:  Length: 13.5 cm. Not as well visualized distal left kidney, but echogenicity is within normal limits. No mass or hydronephrosis  visualized.  Left Kidney:  Length: 14.7 cm. Echogenicity within normal limits. No mass or hydronephrosis visualized.  Bladder:  Not visible, reportedly a Foley catheter is in place.  IMPRESSION: No hydronephrosis.  Negative sonographic appearance of the kidneys.   Electronically Signed   By: Augusto GambleLee  Hall M.D.   On: 2013-11-07 16:14   Dg Chest Port 1 View  2013-11-07   CLINICAL DATA:  58 year old male status post central line placement.  EXAM: PORTABLE CHEST - 1 VIEW  COMPARISON:  Multiple plain film of the same date.  FINDINGS: Cardiomediastinal silhouette not well evaluated on this limited portable exam.  Persistent right greater than left lung opacity with evidence of pleural  effusion.  Tip of the left IJ central catheter appears to terminate in superior vena cava.  Gastric tube projects over the left upper quadrant in the region of the stomach, though was not well visualized in the mediastinum.  IMPRESSION: Limited plain film demonstrates tip of left IJ catheter terminating near the superior vena cava.  Gastric tube side port projects over the stomach silhouette in the left upper quadrant.  Persistent bilateral, right greater than left airspace and interstitial disease with associated right pleural effusion.  Signed,  Yvone NeuJaime S. Loreta AveWagner, DO  [facility]  [facility]   Electronically Signed   By: Gilmer MorJaime  Wagner D.O.   On: 2013-11-07 19:59   Dg Chest Port 1 View  2013-11-07   CLINICAL DATA:  58 year old male with history of COPD, congestive heart failure. Status post central line placement.  EXAM: PORTABLE CHEST - 1 VIEW  COMPARISON:  Multiple plain film of the same date.  FINDINGS: Cardiomediastinal silhouette partially obscured by overlying lung and pleural disease, relatively unchanged.  Similar appearance of right greater the left interstitial and airspace disease. Fullness of the central vasculature persists. Interlobular septal thickening.  On the right there is peripheral opacity obscuring the airspace.  Unchanged position of endotracheal tube. There has been interval placement of a left IJ central venous catheter. The tip cannot be visualized given the underpenetrated film.  Gastric tube projects over the mediastinum, again which the tip cannot be identified.  No visualized left pneumothorax.  IMPRESSION: Similar appearance of the chest x-ray with evidence of congestive heart failure and right-sided pleural effusion.  Unchanged position of endotracheal tube, with interval placement of left IJ central catheter and gastric tube. The tip of the left IJ central catheter and the gastric tube cannot be visualized secondary to the under  penetrated film, although the catheter does travel into the brachiocephalic vein. No visualized left pneumothorax.  Signed,  Yvone NeuJaime S. Loreta AveWagner, DO  [facility]  [facility]   Electronically Signed   By: Gilmer MorJaime  Wagner D.O.   On: 2013-11-07 18:15   Dg Chest Port 1 View  2013-11-07   CLINICAL DATA:  Status post intubation.  EXAM: PORTABLE CHEST - 1 VIEW  COMPARISON:  Plain film of the chest 2013-11-07 at 8:22 a.m.  FINDINGS: Endotracheal tube is in place with the tip just above the clavicular heads and well above the carina. A limited by the patient's size, there appears to be bilateral airspace disease. Aeration in the left chest appears worsened.  IMPRESSION: ET tube projects in good position.  Extensive bilateral airspace disease appears worsened on the left since the study earlier today   Electronically Signed   By: Drusilla Kannerhomas  Dalessio M.D.   On: 2013-11-07 13:16   Dg Abd Portable 1v  2013-11-07  CLINICAL DATA:  Orogastric tube placement.  EXAM: PORTABLE ABDOMEN - 1 VIEW  COMPARISON:  None.  FINDINGS: Limited exam due to technique and patient's condition. NG tube tip is noted with tip under the hemidiaphragm. More distal placement should be considered. Central line noted with tip projected over superior vena cava. Prominent right pleural effusion noted. Underlying right lower lobe atelectasis and/or consolidation cannot be excluded.  IMPRESSION: 1. NG tube noted with its tip just below the left hemidiaphragm. More distal placement should be considered. 2. Central line noted with its tip projected over superior vena cava appear large right pleural effusion. Right lower lobe atelectasis and/or consolidation noted .   Electronically Signed   By: Thomas  Register   On: 12/11/2013 18:35     ASSESSMENT / PLAN:  PULMONARYMaisie Fus OETT 10/14>>>  Acute on chronic respiratory failure - likely multifactorial r/t CAP, CHF, OHS/OSA and underlying COPD +/- AECOPD R hemithorax  atx/collapse v effusion on [facility] CXR Difficult airway - per OSH reports P:   ABg reviewed, keep rr / tv If not to 50% then peep to 8  If not improved will need bronch in am  pcxr in am  If to 50% peep 5, then cpap 5 ps 5, goal 2 hrs Consider neg balance  CARDIOVASCULAR Hx CHF  Hx HTN  P:  ehco poor Lasix likely has diastolic  RENAL Hx renal calculi, R flank pain - renal function ok per [facility] Overloaded on exam, effusions likley P:   Renal ultrasound  Reviewed Lasix 40 bid Chem in am  GASTROINTESTINAL Morbid obesity, constipation P:   PPI  Start feeds lft in am  Enema requested, start with lactulose  HEMATOLOGIC No active issue  P:  F/u cbc  SQ heparin  No lov  INFECTIOUS Suspected CAP - [facility] but pt is from home, no known recent hospitalization P:   BCx2 10/14>>>sent? UC 10/14>>> Sputum 10/14>>> Rocephin 10/14>>> Azithro 10/14>>>  May need bronch  ENDOCRINE Hyperglycemia  P:   SSI  HgbA1c   NEUROLOGIC Encephalopathy  P:   RASS goal: 0 Sedation protocol while on vent  Daily WUA  May need to limit fent with constipation  Family updated: no family available 10/14   TODAY'S SUMMARY:  Wean, may need bronch, need a BM, imporved fio2  I have personally obtained a history, examined the patient, evaluated laboratory and imaging results, formulated the assessment and plan and placed orders. CRITICAL CARE: The patient is critically ill with multiple organ systems failure and requires high complexity decision making for assessment and support, frequent evaluation and titration of therapies, application of advanced monitoring technologies and extensive interpretation of multiple databases. Critical Care Time devoted to patient care services described in this note is 30 minutes.    Mcarthur Rossettianiel J. Tyson AliasFeinstein, MD, FACP Pgr: [phone] [place]

## 2013-12-17 ENCOUNTER — Encounter (HOSPITAL_COMMUNITY): Payer: Self-pay | Admitting: *Deleted

## 2013-12-17 ENCOUNTER — Inpatient Hospital Stay (HOSPITAL_COMMUNITY): Payer: Medicare Other

## 2013-12-17 DIAGNOSIS — J441 Chronic obstructive pulmonary disease with (acute) exacerbation: Secondary | ICD-10-CM | POA: Diagnosis not present

## 2013-12-17 DIAGNOSIS — J80 Acute respiratory distress syndrome: Secondary | ICD-10-CM

## 2013-12-17 LAB — GLUCOSE, CAPILLARY
Glucose-Capillary: 120 mg/dL — ABNORMAL HIGH (ref 70–99)
Glucose-Capillary: 121 mg/dL — ABNORMAL HIGH (ref 70–99)
Glucose-Capillary: 134 mg/dL — ABNORMAL HIGH (ref 70–99)
Glucose-Capillary: 148 mg/dL — ABNORMAL HIGH (ref 70–99)
Glucose-Capillary: 149 mg/dL — ABNORMAL HIGH (ref 70–99)
Glucose-Capillary: 182 mg/dL — ABNORMAL HIGH (ref 70–99)

## 2013-12-17 LAB — COMPREHENSIVE METABOLIC PANEL
ALK PHOS: 64 U/L (ref 39–117)
ALT: 7 U/L (ref 0–53)
ANION GAP: 9 (ref 5–15)
AST: 11 U/L (ref 0–37)
Albumin: 2.5 g/dL — ABNORMAL LOW (ref 3.5–5.2)
BILIRUBIN TOTAL: 0.3 mg/dL (ref 0.3–1.2)
BUN: 17 mg/dL (ref 6–23)
CHLORIDE: 101 meq/L (ref 96–112)
CO2: 28 meq/L (ref 19–32)
CREATININE: 1.28 mg/dL (ref 0.50–1.35)
Calcium: 8.8 mg/dL (ref 8.4–10.5)
GFR, EST AFRICAN AMERICAN: 69 mL/min — AB (ref >90)
GFR, EST NON AFRICAN AMERICAN: 60 mL/min — AB (ref >90)
GLUCOSE: 113 mg/dL — AB (ref 70–99)
POTASSIUM: 4.1 meq/L (ref 3.7–5.3)
Sodium: 138 mEq/L (ref 137–147)
Total Protein: 6.6 g/dL (ref 6.0–8.3)

## 2013-12-17 LAB — CBC WITH DIFFERENTIAL/PLATELET
BASOS PCT: 0 % (ref 0–1)
Basophils Absolute: 0 10*3/uL (ref 0.0–0.1)
Eosinophils Absolute: 0.1 10*3/uL (ref 0.0–0.7)
Eosinophils Relative: 1 % (ref 0–5)
HEMATOCRIT: 34.7 % — AB (ref 39.0–52.0)
Hemoglobin: 10.9 g/dL — ABNORMAL LOW (ref 13.0–17.0)
Lymphocytes Relative: 12 % (ref 12–46)
Lymphs Abs: 1.3 10*3/uL (ref 0.7–4.0)
MCH: 27.3 pg (ref 26.0–34.0)
MCHC: 31.4 g/dL (ref 30.0–36.0)
MCV: 86.8 fL (ref 78.0–100.0)
MONO ABS: 0.9 10*3/uL (ref 0.1–1.0)
Monocytes Relative: 8 % (ref 3–12)
NEUTROS ABS: 8.6 10*3/uL — AB (ref 1.7–7.7)
Neutrophils Relative %: 79 % — ABNORMAL HIGH (ref 43–77)
PLATELETS: 278 10*3/uL (ref 150–400)
RBC: 4 MIL/uL — ABNORMAL LOW (ref 4.22–5.81)
RDW: 15.4 % (ref 11.5–15.5)
WBC: 10.9 10*3/uL — AB (ref 4.0–10.5)

## 2013-12-17 MED ORDER — FLEET ENEMA 7-19 GM/118ML RE ENEM
1.0000 | ENEMA | Freq: Every day | RECTAL | Status: DC | PRN
  Administered 2013-12-17: 1 via RECTAL
  Filled 2013-12-17: qty 1

## 2013-12-17 MED ORDER — ONDANSETRON HCL 4 MG/2ML IJ SOLN
4.0000 mg | Freq: Four times a day (QID) | INTRAMUSCULAR | Status: DC | PRN
  Administered 2013-12-17 (×2): 4 mg via INTRAVENOUS
  Filled 2013-12-17 (×2): qty 2

## 2013-12-17 MED ORDER — FUROSEMIDE 10 MG/ML IJ SOLN
160.0000 mg | Freq: Three times a day (TID) | INTRAVENOUS | Status: DC
  Administered 2013-12-17 – 2013-12-18 (×3): 160 mg via INTRAVENOUS
  Filled 2013-12-17 (×5): qty 16

## 2013-12-17 NOTE — Progress Notes (Signed)
Pt pulling at his ETT holder and pulled his ETT to 20 cm from 26cm@ the lip. RT advanced tube back to its original placement. Chest xray pending. No complications. Vital signs stable at this time. RN at bedside. RT will continue to monitor.

## 2013-12-17 NOTE — Progress Notes (Signed)
RT replaced pt ETT holder. Patient tolerated well. No complications. RT will continue to monitor.

## 2013-12-17 NOTE — Progress Notes (Signed)
PULMONARY / CRITICAL CARE MEDICINE   Name: Ruben Gray MRN: [id number] DOB: 04-30-1955    ADMISSION DATE:  12/27/2013  REFERRING MD :  Maryruth [facility]   CHIEF COMPLAINT:  Respiratory failure   INITIAL PRESENTATION: 57yo morbidly obese male with hx COPD, CAD, HTN, CHF [facility] with R sided flank pain, progressive SOB x 2 days.  Failed bipap and was intubated in ER with reported difficult airway, size 7.0ETT and [facility].   STUDIES:  2D echo 10/14>>>EF wnl probably, poor images Renal u/s 10/14>>>neg hydro  SIGNIFICANT EVENTS: [facility] lasix started, O2 needs better  SUBJECTIVE: failed weaning this am   VITAL SIGNS: Temp:  [98.3 F (36.8 C)-99.4 F (37.4 C)] 99.4 F (37.4 C) (10/16 0737) Pulse Rate:  [85-105] 99 (10/16 0810) Resp:  [16-35] 22 (10/16 0810) BP: (93-160)/(46-82) 133/68 mmHg (10/16 0810) SpO2:  [91 %-100 %] 92 % (10/16 0810) FiO2 (%):  [40 %] 40 % (10/16 0810) Weight:  [225.438 kg (497 lb)] 225.438 kg (497 lb) (10/16 0442) HEMODYNAMICS:   VENTILATOR SETTINGS: Vent Mode:  [-] PRVC FiO2 (%):  [40 %] 40 % Set Rate:  [20 bmp] 20 bmp Vt Set:  [650 mL] 650 mL PEEP:  [5 cmH20] 5 cmH20 Plateau Pressure:  [16 cmH20-29 cmH20] 21 cmH20 INTAKE / OUTPUT:  Intake/Output Summary (Last 24 hours) at 12/17/13 1001 Last data filed at 12/17/13 0700  Gross per 24 hour  Intake 2706.67 ml  Output   1345 ml  Net 1361.67 ml    PHYSICAL EXAMINATION: General:  Morbidly obese, vent Neuro:  RASS 0, follows all commands, moves all ext HEENT:  Mm dry,  Tough to tell JVD, ETT Cardiovascular:  s1s2 rrr distant Lungs:  Reduced, ronchi, distant Abdomen:  Obese, enormous pannus, soft, +bs Musculoskeletal:  Warm and dry, 1+ BLE edema   LABS:  CBC  Recent Labs Lab 12/11/2013 1327 12/16/13 0110 12/17/13 0450  WBC 15.8* 13.6* 10.9*  HGB 12.1* 11.2* 10.9*  HCT 38.9* 35.5* 34.7*  PLT 259 253 278   Coag's No  results found for this basename: APTT, INR,  in the last 168 hours BMET  Recent Labs Lab 12/16/2013 1327 12/16/13 0110 12/17/13 0450  NA 137 134* 138  K 4.1 4.2 4.1  CL 100 99 101  CO2 26 26 28   BUN 10 13 17   CREATININE 0.89 1.16 1.28  GLUCOSE 154* 118* 113*   Electrolytes  Recent Labs Lab 12/11/2013 1327 12/16/13 0110 12/17/13 0450  CALCIUM 8.2* 8.2* 8.8   Sepsis Markers  Recent Labs Lab 12/26/2013 1327  LATICACIDVEN 1.6  PROCALCITON <0.10   ABG  Recent Labs Lab 12/07/2013 1238 12/16/13 0500  PHART 7.287* 7.330*  PCO2ART 57.8* 50.2*  PO2ART 73.0* 103.0*   Liver Enzymes  Recent Labs Lab 12/27/2013 1327 12/17/13 0450  AST 12 11  ALT 8 7  ALKPHOS 61 64  BILITOT 0.3 0.3  ALBUMIN 2.5* 2.5*   Cardiac Enzymes  Recent Labs Lab 12/12/2013 1327 12/02/2013 1800 12/16/13 0110  TROPONINI <0.30 <0.30 <0.30  PROBNP 737.5*  --   --    Glucose  Recent Labs Lab 12/16/13 1218 12/16/13 1529 12/16/13 1916 12/16/13 2350 12/17/13 0434 12/17/13 0736  GLUCAP 124* 116* 101* 121* 120* 134*    Imaging No results found.   ASSESSMENT / PLAN:  PULMONARY OETT 10/14>>>  Acute on chronic respiratory failure - likely multifactorial r/t CAP, CHF, OHS/OSA and underlying COPD +/- AECOPD R  hemithorax atx/collapse v effusion on [facility] CXR Difficult airway - per OSH reports malplaced ett (high) Small ETT P:   No bronch needed, LL improved aeration and O2 needs improved Wean cpap 5 ps 5-10, failed, need neg balance Consider neg balance, need further lasix Advance ett further Please note, may wean and extubate from PS 8-10, cpap 8 if needed with size and ett size May need to use automatic tube compensation on this vent with weaning  CARDIOVASCULAR Hx CHF  Hx HTN  P:  Lasix increase to 160 q8h tele  RENAL Hx renal calculi, R flank pain - renal function ok per [facility] Overloaded on exam, lasix dep at home Pos balance P:   Lasix escallation Chem in  am  GASTROINTESTINAL Morbid obesity, constipation chronic ( uses enema at home) P:   PPI  TF to goal lft in am  Enema requested, will give it to him colace Continued lactulose  HEMATOLOGIC No active issue  P:   SQ heparin  No Lovenox, would not be safely does  INFECTIOUS Suspected CAP - [facility] but pt [facility], no known recent hospitalization PCT unimpressive Clinically not infected P:   BCx2 10/14>>>sent? UC 10/14>>> Sputum 10/14>>> Rocephin 10/14>>>10/16 Azithro 10/14>>>stop in 2 days   ENDOCRINE Hyperglycemia  P:   SSI   NEUROLOGIC Encephalopathy  P:   RASS goal: 0 Sedation protocol while on vent  Daily WUA  May need to limit fent with constipation  Family updated: no family available 10/14   TODAY'S SUMMARY:  Wean, may need higher PS with 7 tube, lasix increase  I have personally obtained a history, examined the patient, evaluated laboratory and imaging results, formulated the assessment and plan and placed orders. CRITICAL CARE: The patient is critically ill with multiple organ systems failure and requires high complexity decision making for assessment and support, frequent evaluation and titration of therapies, application of advanced monitoring technologies and extensive interpretation of multiple databases. Critical Care Time devoted to patient care services described in this note is 30 minutes.    Mcarthur Rossettianiel J. Tyson AliasFeinstein, MD, FACP Pgr: [phone] [place] [facility]

## 2013-12-17 NOTE — Clinical Documentation Improvement (Signed)
12/17/13 prog note.Ruben Gray.Ruben Gray."Acute on chronic respiratory failure - likely multifactorial r/t CAP, CHF, OHS/OSA and underlying COPD +/- AECOPD ;R hemithorax atx/collapse v effusion on [facility] CXR..."..."P: Lasix increase to 160 q8h.Ruben Gray.Ruben Gray."Overloaded on exam, lasix dep at home Pos balance P: Lasix escallation; Chem in am..." 12/17/13 CXR: IMPRESSION: "Endotracheal tube 8 cm above the carina ;Congestive heart failure with edema and right pleural effusion unchanged" 12/06/2013   1327  PROBNP 737.5*  For accurate Dx specificity & severity can noted "CHF" be further specified with type & acuity for cond being eval's, mon's & tx'd. Thank you . Document acuity --Acute --Chronic --Acute on Chronic . Document type --Diastolic --Systolic --Combined systolic and diastolic --Other (specify) Unable to determine  Supporting Information: See above note  Thank You,  Toribio Harbourphelia R Keagen Heinlen, RN, BSN, CCDS Certified Clinical Documentation Specialist Pager: [phone] [place]: Health Information Management

## 2013-12-18 ENCOUNTER — Inpatient Hospital Stay (HOSPITAL_COMMUNITY): Payer: Medicare Other

## 2013-12-18 DIAGNOSIS — J9622 Acute and chronic respiratory failure with hypercapnia: Secondary | ICD-10-CM

## 2013-12-18 DIAGNOSIS — T884XXD Failed or difficult intubation, subsequent encounter: Secondary | ICD-10-CM

## 2013-12-18 DIAGNOSIS — I469 Cardiac arrest, cause unspecified: Secondary | ICD-10-CM

## 2013-12-18 LAB — GLUCOSE, CAPILLARY
GLUCOSE-CAPILLARY: 132 mg/dL — AB (ref 70–99)
Glucose-Capillary: 122 mg/dL — ABNORMAL HIGH (ref 70–99)
Glucose-Capillary: 145 mg/dL — ABNORMAL HIGH (ref 70–99)
Glucose-Capillary: 152 mg/dL — ABNORMAL HIGH (ref 70–99)

## 2013-12-18 LAB — BASIC METABOLIC PANEL
Anion gap: 10 (ref 5–15)
BUN: 22 mg/dL (ref 6–23)
CALCIUM: 9 mg/dL (ref 8.4–10.5)
CO2: 30 meq/L (ref 19–32)
Chloride: 98 mEq/L (ref 96–112)
Creatinine, Ser: 1.36 mg/dL — ABNORMAL HIGH (ref 0.50–1.35)
GFR calc Af Amer: 64 mL/min — ABNORMAL LOW (ref >90)
GFR calc non Af Amer: 55 mL/min — ABNORMAL LOW (ref >90)
Glucose, Bld: 132 mg/dL — ABNORMAL HIGH (ref 70–99)
POTASSIUM: 4 meq/L (ref 3.7–5.3)
SODIUM: 138 meq/L (ref 137–147)

## 2013-12-18 LAB — CBC WITH DIFFERENTIAL/PLATELET
Basophils Absolute: 0 10*3/uL (ref 0.0–0.1)
Basophils Relative: 0 % (ref 0–1)
EOS PCT: 2 % (ref 0–5)
Eosinophils Absolute: 0.2 10*3/uL (ref 0.0–0.7)
HEMATOCRIT: 34.8 % — AB (ref 39.0–52.0)
Hemoglobin: 11.1 g/dL — ABNORMAL LOW (ref 13.0–17.0)
LYMPHS ABS: 1.5 10*3/uL (ref 0.7–4.0)
LYMPHS PCT: 14 % (ref 12–46)
MCH: 27.5 pg (ref 26.0–34.0)
MCHC: 31.9 g/dL (ref 30.0–36.0)
MCV: 86.1 fL (ref 78.0–100.0)
MONO ABS: 0.8 10*3/uL (ref 0.1–1.0)
Monocytes Relative: 7 % (ref 3–12)
Neutro Abs: 8.4 10*3/uL — ABNORMAL HIGH (ref 1.7–7.7)
Neutrophils Relative %: 77 % (ref 43–77)
Platelets: 311 10*3/uL (ref 150–400)
RBC: 4.04 MIL/uL — AB (ref 4.22–5.81)
RDW: 15.3 % (ref 11.5–15.5)
WBC: 10.8 10*3/uL — ABNORMAL HIGH (ref 4.0–10.5)

## 2013-12-18 LAB — CULTURE, RESPIRATORY

## 2013-12-18 LAB — CULTURE, RESPIRATORY W GRAM STAIN

## 2013-12-18 MED ORDER — VITAL HIGH PROTEIN PO LIQD
1000.0000 mL | ORAL | Status: DC
  Administered 2013-12-18: 1000 mL
  Filled 2013-12-18 (×4): qty 1000

## 2013-12-18 MED ORDER — MIDAZOLAM HCL 2 MG/2ML IJ SOLN
INTRAMUSCULAR | Status: AC
  Filled 2013-12-18: qty 2

## 2013-12-18 MED ORDER — FENTANYL CITRATE 0.05 MG/ML IJ SOLN
INTRAMUSCULAR | Status: AC
  Filled 2013-12-18: qty 2

## 2013-12-18 MED ORDER — HEPARIN BOLUS VIA INFUSION
5000.0000 [IU] | Freq: Once | INTRAVENOUS | Status: DC
  Filled 2013-12-18: qty 5000

## 2013-12-18 MED ORDER — HEPARIN (PORCINE) IN NACL 100-0.45 UNIT/ML-% IJ SOLN
2050.0000 [IU]/h | INTRAMUSCULAR | Status: DC
Start: 2013-12-18 — End: 2013-12-18
  Filled 2013-12-18 (×2): qty 250

## 2013-12-18 MED ORDER — NOREPINEPHRINE BITARTRATE 1 MG/ML IV SOLN
2.0000 ug/min | INTRAVENOUS | Status: DC
  Filled 2013-12-18: qty 4

## 2013-12-18 MED ORDER — SPIRONOLACTONE 50 MG PO TABS
50.0000 mg | ORAL_TABLET | Freq: Two times a day (BID) | ORAL | Status: DC
  Filled 2013-12-18: qty 1

## 2013-12-18 MED ORDER — FENTANYL CITRATE 0.05 MG/ML IJ SOLN: 12.5000 ug | INTRAMUSCULAR | Status: DC | PRN

## 2013-12-18 MED ORDER — FUROSEMIDE 10 MG/ML IJ SOLN: 80.0000 mg | Freq: Three times a day (TID) | INTRAMUSCULAR | Status: DC

## 2013-12-18 MED ORDER — BUDESONIDE 0.25 MG/2ML IN SUSP
0.2500 mg | Freq: Four times a day (QID) | RESPIRATORY_TRACT | Status: DC
  Administered 2013-12-18: 0.25 mg via RESPIRATORY_TRACT
  Filled 2013-12-18 (×4): qty 2

## 2013-12-20 LAB — GLUCOSE, CAPILLARY: GLUCOSE-CAPILLARY: 167 mg/dL — AB (ref 70–99)

## 2013-12-22 LAB — CULTURE, BLOOD (ROUTINE X 2)
CULTURE: NO GROWTH
Culture: NO GROWTH

## 2013-12-25 MED FILL — Medication: Qty: 1 | Status: AC

## 2014-01-02 NOTE — Progress Notes (Signed)
Chaplain Note: Provided grief care and emotional support for patient's wife, daughter and son-in-law when they arrived.  Facilitated family's movement between unit and waiting area. Sat with patient's wife in waiting area when she could no longer bear to be in the room with the deceased.   Rutherford NailLeah Smith, Chaplain

## 2014-01-02 NOTE — Progress Notes (Signed)
PULMONARY / CRITICAL CARE MEDICINE   Name: Ruben Gray MRN: [id number] DOB: Dec 11, 1955    ADMISSION DATE:  12/14/2013  REFERRING MD :  [facility]   CHIEF COMPLAINT:  Respiratory failure   INITIAL PRESENTATION: 57yo morbidly obese male with hx COPD, CAD, HTN, CHF [facility] with R sided flank pain, progressive SOB x 2 days.  Failed bipap and was intubated in ER with reported difficult airway, size 7.0ETT and tx [facility].   STUDIES:  2D echo 10/14>>>EF wnl probably, poor images Renal u/s 10/14>>>neg hydro  SIGNIFICANT EVENTS: [facility]- lasix started, O2 needs better  SUBJECTIVE:   RASS +1. + F/C  VITAL SIGNS: Temp:  [98.3 F (36.8 C)-100.5 F (38.1 C)] 100.5 F (38.1 C) (10/17 0800) Pulse Rate:  [77-119] 97 (10/17 0759) Resp:  [18-47] 20 (10/17 0759) BP: (122-154)/(63-137) 135/71 mmHg (10/17 0700) SpO2:  [92 %-98 %] 96 % (10/17 0759) FiO2 (%):  [40 %] 40 % (10/17 0759) Weight:  [494 lb (224.077 kg)] 494 lb (224.077 kg) (10/17 0300) HEMODYNAMICS:   VENTILATOR SETTINGS: Vent Mode:  [-] CPAP;PSV FiO2 (%):  [40 %] 40 % Set Rate:  [20 bmp] 20 bmp Vt Set:  [650 mL] 650 mL PEEP:  [5 cmH20] 5 cmH20 Pressure Support:  [10 cmH20] 10 cmH20 Plateau Pressure:  [24 cmH20-36 cmH20] 26 cmH20 INTAKE / OUTPUT:  Intake/Output Summary (Last 24 hours) at Jun 03, 2013 [id number] Last data filed at Jun 03, 2013 0800  Gross per 24 hour  Intake   4218 ml  Output   3225 ml  Net    993 ml    PHYSICAL EXAMINATION: General:  Morbidly obese Neuro:  No focal deficits HEENT: WNL, edentulous Cardiovascular:  Re, no M noted Lungs: distant BS, no adventitious sounds Abdomen:  Extremely obese, enormous pannus, soft, +bs Ext:  Warm and dry, 1+ BLE edema, venous stasis   LABS: I have reviewed all of today's lab results. Relevant abnormalities are discussed in the A/P section  CXR 10/17>>no sig change mild pulm edema, R effusion   ASSESSMENT  / PLAN:  PULMONARY OETT 10/14>>> 10/17 Acute on chronic respiratory failure - likely multifactorial  R pleural effusion Difficult airway - per OSH reports P:   Passed SBT 10/17 > high risk extubation today Supplemental O2 to maintain SpO2 90-95%   CARDIOVASCULAR Hx CHF  Hx HTN  P:  Cont diuresis   RENAL Hx renal calculi, R flank pain - renal u/s ok.  P:   Monitor BMET intermittently Monitor I/Os Correct electrolytes as indicated  GASTROINTESTINAL Morbid obesity Constipation chronic (uses enema at home) P:   SU: N/I post extubation NPO post extubation   HEMATOLOGIC No active issue  P:   DVT px: SQ heparin  DVT px: SQ heparin Monitor CBC intermittently Transfuse per usual ICU guidelines  INFECTIOUS Suspected CAP - [facility] but [facility], no known recent hospitalization PCT unimpressive Clinically not infected P:   BCx2 10/14>>>sent? UC 10/14>>>neg  Sputum 10/14>>>rare candida  Rocephin 10/14>>>10/16 Azithro 10/14>>>10/17  Monitor off abx    ENDOCRINE Hyperglycemia  P:   SSI   NEUROLOGIC Encephalopathy, resolved P:   RASS goal: 0 Minimize sedation   Family updated:    Dirk DressKaty Whiteheart, NP 02-Sep-2013  8:33 AM Pager: [phone] or [phone]  I have personally obtained a history, examined the patient, evaluated laboratory and imaging results, formulated the assessment and plan and placed orders. CRITICAL CARE:  The patient is critically ill with multiple organ systems failure and requires high complexity decision making for assessment and support, frequent evaluation and titration of therapies, application of advanced monitoring technologies and extensive interpretation of multiple databases. Critical Care Time devoted to patient care services described in this note is 35 minutes.   Billy Fischeravid Wylan Gentzler, MD ; [facility] Mobile [phone].  After 5:30 PM or weekends, call [phone]

## 2014-01-02 NOTE — Progress Notes (Signed)
Chaplain Note: Responded to code blue. No family present. Provided ministry of presence. Nurse to page when family arrives.  Rutherford NailLeah Gray, Chaplain

## 2014-01-02 NOTE — Procedures (Signed)
ACLS  While on SBT this AM, pt suffered sudden bradycardic PEA and underwent ACLS including chest compressions (to the extent possible given his immense size) for approx 12-15 mins. He had ROSC and subsequently appeared somewhat agitated. Shortly thereafter, he again suffered bradycardic PEA arrest and again underwent ACLS. It appeared that his ETT had become malpositioned and he underwent reintubation. He received atropine (for bradyarrhythmia), epi X 3 (for pulselessness), amiodarone (VF), 15 mins of chest compressions without restoration of a perfusing rhythm. At that point, it was deemed that further ACLS interventions would not likely lead to a favorable outcome and efforts at resuscitation were discontinued. All ACLS was administered under my direct supervision.  See code blue records for details  Billy Fischeravid Simonds, MD ; [facility] Mobile [phone].  After 5:30 PM or weekends, call [phone]

## 2014-01-02 NOTE — Discharge Summary (Signed)
DEATH SUMMARY  DATE OF ADMISSION:  12/20/2013  DATE OF DISCHARGE/DEATH:  12/12/2013  ADMISSION DIAGNOSES:   Acute on chronic resp failure - multifactorial R lung collapse Difficult intubation History of CHF, Htn History of renal calculi Massive obesity Suspected CAP DM 2 Acute encephalopathy  DISCHARGE DIAGNOSES:   Acute on chronic resp failure - multifactorial R lung collapse Difficult intubation History of CHF, Htn History of renal calculi Massive obesity Suspected CAP DM 2 Acute encephalopathy Severe hypervolemia Cardiac arrest - PEA  PRESENTATION:   Pt was admitted with the following HPI and the above admission diagnoses:  HISTORY OF PRESENT ILLNESS: 57yo morbidly obese male with hx COPD, CAD, HTN, CHF [facility] with R sided flank pain, progressive SOB x 2 days. Failed bipap and was intubated in ER with reported difficult airway, size 7.0 ETT and [facility].    HOSPITAL COURSE:   [facility] and supported on mechanical ventilation for the duration of his hospitalization. Treated empirically with broad spectrum antibiotics. Received the usual DVT and SUP prophylaxis. Underwent aggressive diuresis.   On 10/17, while on SBT this AM, suffered sudden bradycardic PEA and underwent ACLS including chest compressions (to the extent possible given his immense size) for approx 12-15 mins. He had ROSC and subsequently appeared somewhat agitated. Shortly thereafter, he again suffered bradycardic PEA arrest and again underwent ACLS. It appeared that his ETT had become malpositioned and he underwent reintubation. He received atropine (for bradyarrhythmia), epi X 3 (for pulselessness), amiodarone (VF), 15 mins of chest compressions without restoration of a perfusing rhythm. At that point, it was deemed that further ACLS interventions would not likely lead to a favorable outcome and efforts at resuscitation were discontinued. See  code blue records for details. He was pronounced dead shortly thereafter. It was suspected that he suffered a massive PE given the suddenness and the initial rhythm. An autopsy was offered and the family was considering @ the time of this summary  Cause of death:  PEA cardiac arrest Suspected PE  Contributing factors: Acute on chronic respiratory failure Hypervolemia Morbid obesity DM 2   Billy Fischeravid Brooklin Rieger, MD;  PCCM service; Mobile [phone]

## 2014-01-02 NOTE — Progress Notes (Signed)
ANTICOAGULATION CONSULT NOTE - Initial Consult  Pharmacy Consult for Heparin Indication: r/o PE  Allergies  Allergen Reactions  . Sulfa Antibiotics     Itchy rash  . Toradol [Ketorolac Tromethamine]     Jittery, sweat    Patient Measurements: Height: 5' 10.08" (178 cm) Weight: 494 lb (224.077 kg) IBW/kg (Calculated) : 73.18 Heparin Dosing Weight: 131.3 kg  Vital Signs: Temp: 97.6 F (36.4 C) (10/17 1200) Temp Source: Oral (10/17 1200) BP: 133/65 mmHg (10/17 1100) Pulse Rate: 111 (10/17 1131)  Labs:  Recent Labs  12/14/2013 1327 12/21/2013 1800 12/16/13 0110 12/17/13 0450 Oct 31, 2013 0340  HGB 12.1*  --  11.2* 10.9* 11.1*  HCT 38.9*  --  35.5* 34.7* 34.8*  PLT 259  --  253 278 311  CREATININE 0.89  --  1.16 1.28 1.36*  TROPONINI <0.30 <0.30 <0.30  --   --     Estimated Creatinine Clearance: 110.5 ml/min (by C-G formula based on Cr of 1.36).   Medical History: Past Medical History  Diagnosis Date  . Hypertension   . COPD (chronic obstructive pulmonary disease)   . Diabetes mellitus   . High blood cholesterol level   . CHF (congestive heart failure)   . Kidney stone   . Smoker     Assessment: [age] YOM with morbid obesity who went into cardiac arrest on 10/17 AM with ROSC s/p CPR x 15 minutes. Pharmacy consulted to start heparin for full anticoagulation for r/o PE. It is noted the patient received a dose of heparin SQ this morning at 0630 - will continue with full bolus and drip rate.   Goal of Therapy:  Heparin level 0.3-0.7 units/ml Monitor platelets by anticoagulation protocol: Yes   Plan:  1. Heparin bolus of 5000 units x 1 2. Initiate heparin at a drip rate of 2050 units/hr (20.5 ml/hr) 3. Daily heparin level 4. Will continue to monitor for any signs/symptoms of bleeding and will follow up with heparin level in 6 hours   Georgina PillionElizabeth Arvell Pulsifer, PharmD, BCPS Clinical Pharmacist Pager: [phone] 10-24-2013 12:46 PM

## 2014-01-02 DEATH — deceased

## 2015-02-07 IMAGING — CR DG CHEST 1V PORT
2 series · 2 of 2 positions shown · non-contrast
Comparison: Plain film of the chest 12/15/2013 at [DATE] a.m.

CLINICAL DATA: Status post intubation.

EXAM:
PORTABLE CHEST - 1 VIEW

[AP (1 of 2)]
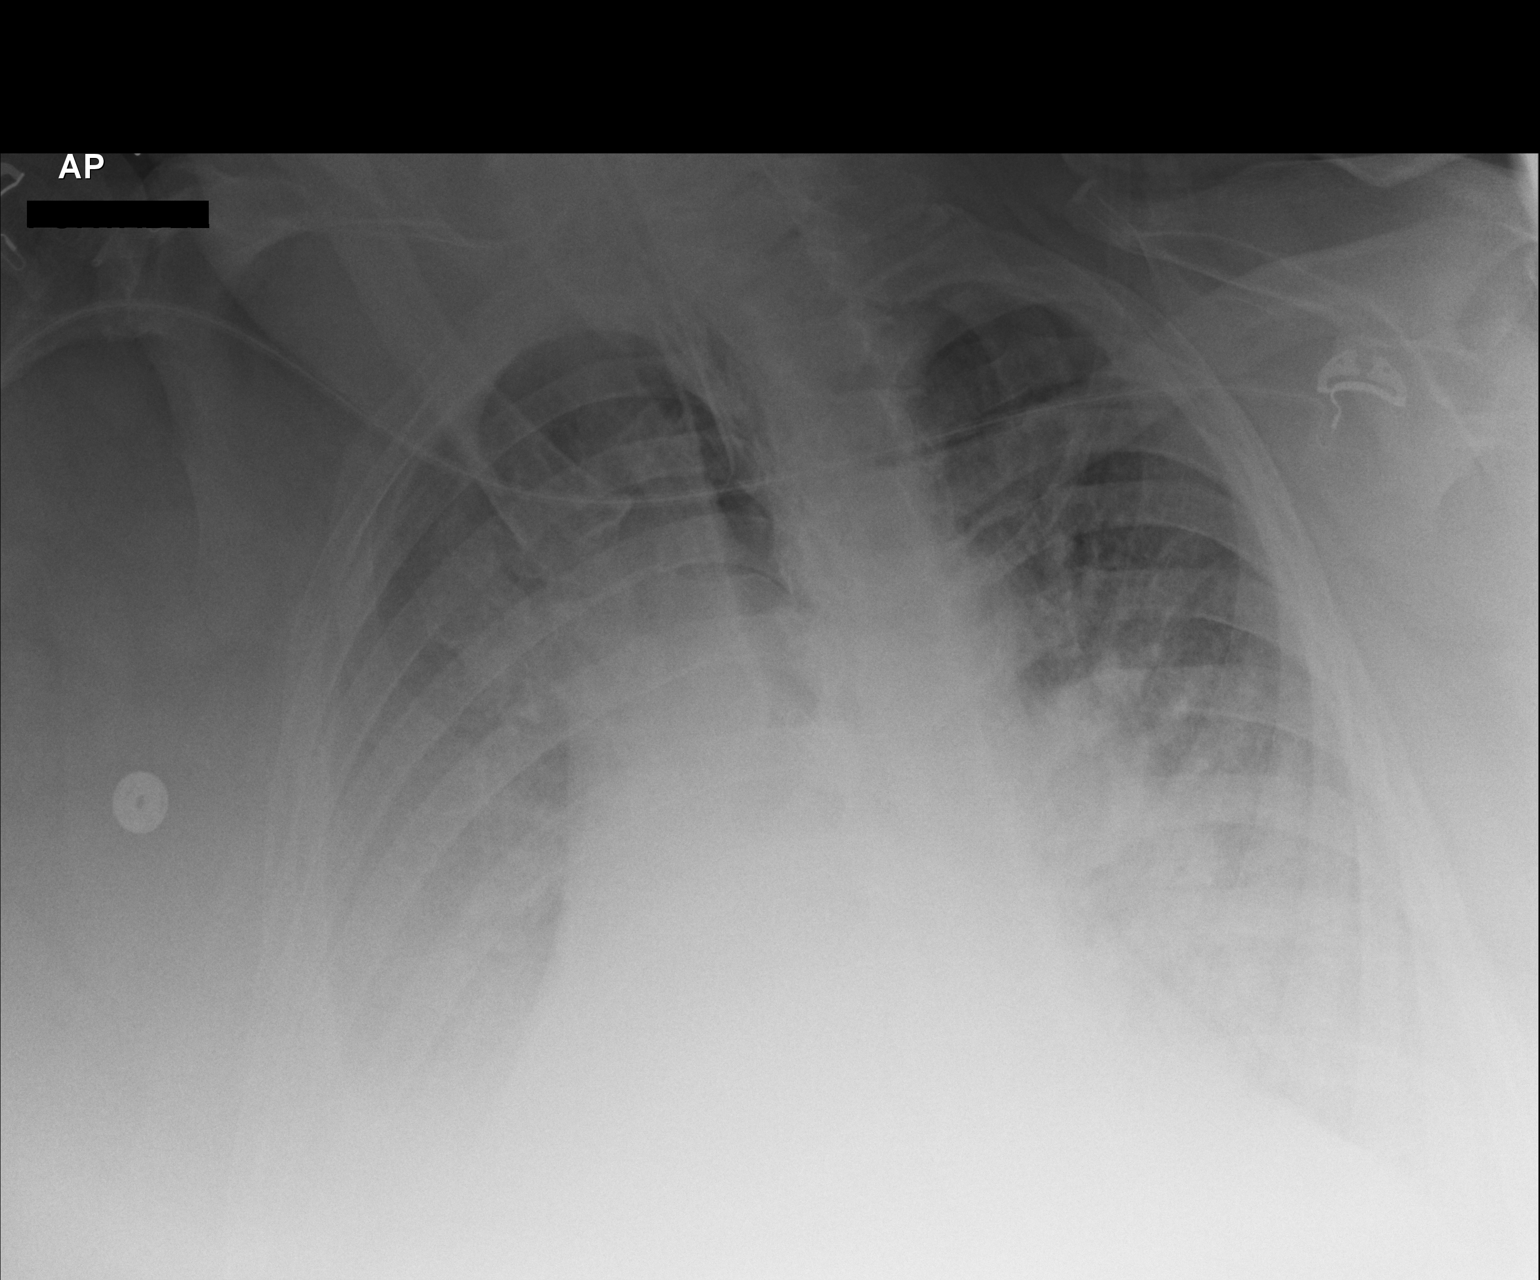

[AP (2 of 2)]
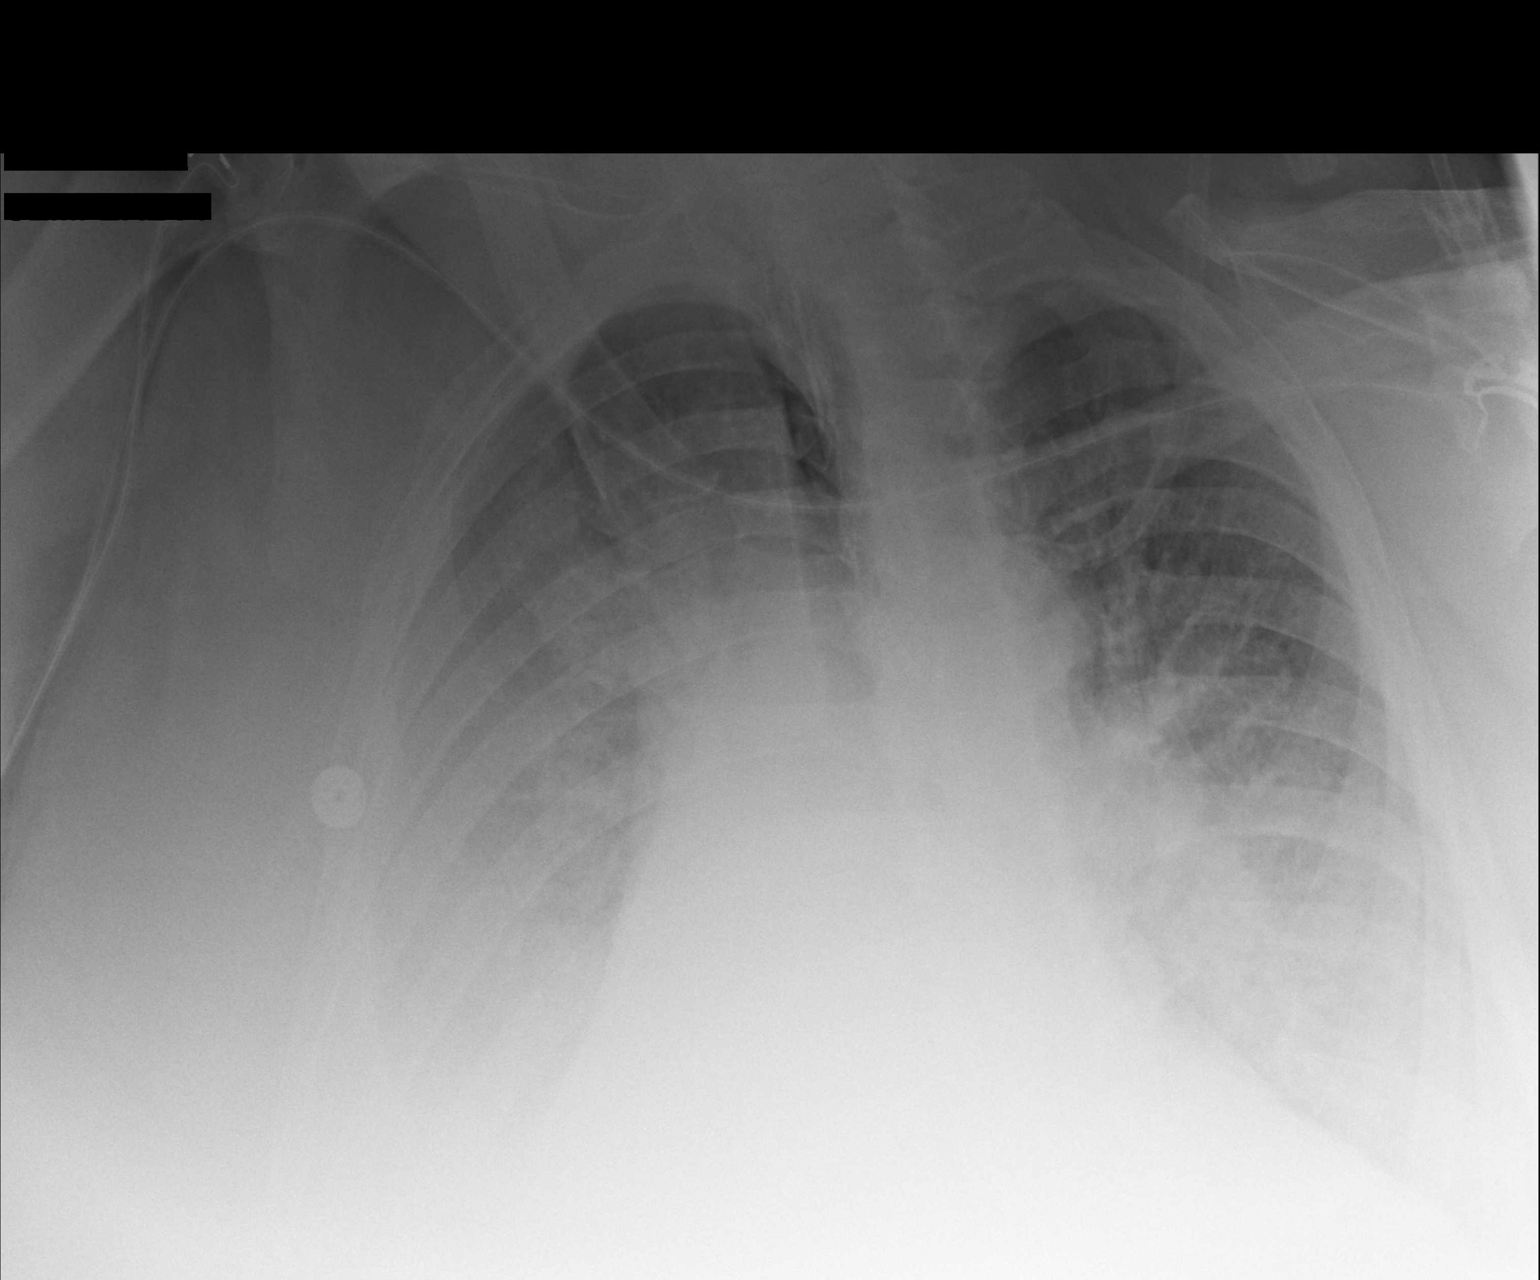

[2 of 2 positions shown; findings below may reference images not displayed]

FINDINGS: Endotracheal tube is in place with the tip just above the clavicular
heads and well above the carina. A limited by the patient's size,
there appears to be bilateral airspace disease. Aeration in the left
chest appears worsened.
IMPRESSION: ET tube projects in good position.

Extensive bilateral airspace disease appears worsened on the left
since the study earlier today

## 2015-02-10 IMAGING — CR DG CHEST 1V PORT
1 series · 1 of 1 positions shown · non-contrast
Comparison: Radiograph [REDACTED]

CLINICAL DATA: Acute respiratory distress

EXAM:
PORTABLE CHEST - 1 VIEW

[AP]
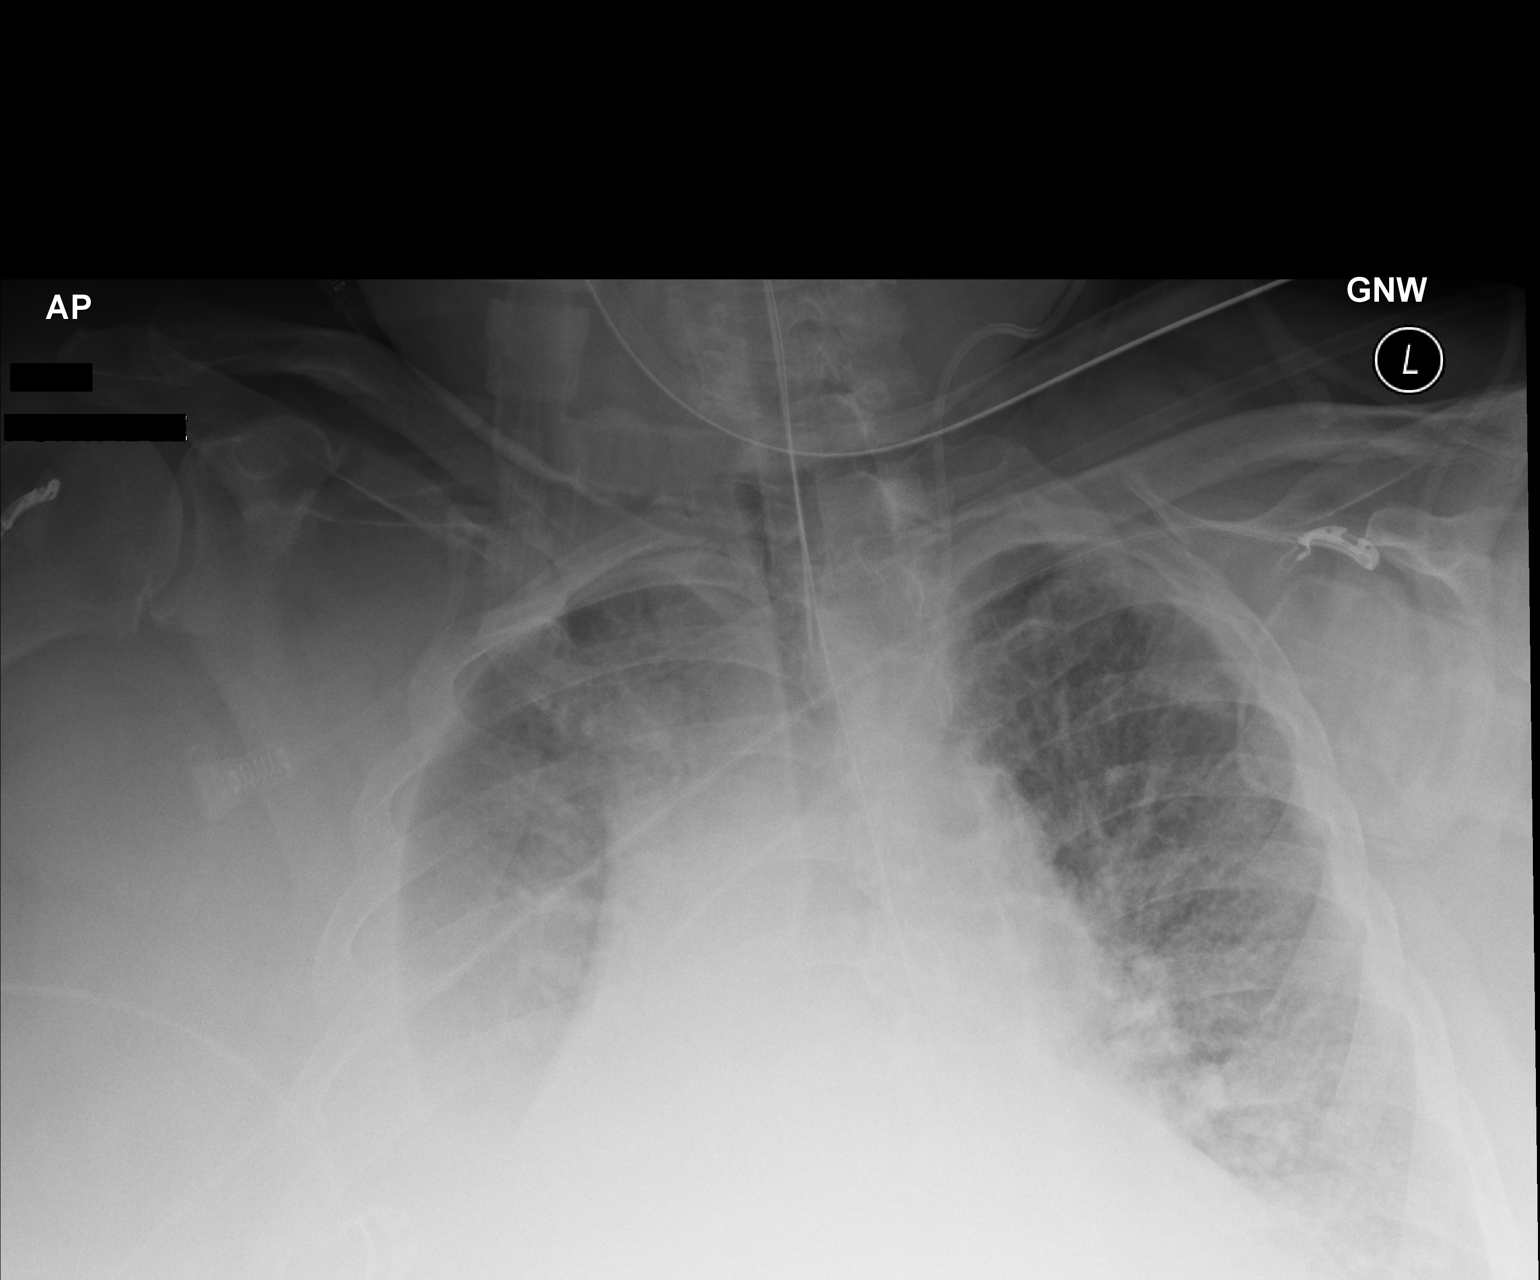

[1 of 1 positions shown; findings below may reference images not displayed]

FINDINGS: Endotracheal tube and NG tube are unchanged. Exam is suboptimal due
to patient positioning. Left central venous line is also unchanged.
The inferior hemi thorax is not imaged. Stable cardiac silhouette
which is enlarged. Bilateral fine airspace disease is noted. Right
pleural effusion.
IMPRESSION: 1. Suboptimal exam due to body habitus.
2. Support apparatus is unchanged.
3. Cardiomegaly, low lung volumes, and interstitial / mild pulmonary
edema.
4. Right pleural effusion
